# Patient Record
Sex: Male | Born: 1935 | Race: White | Hispanic: No | Marital: Married | Smoking: Never smoker
Health system: Southern US, Community
[De-identification: ages and names within clinical notes are randomized; demographics above are authoritative.]

## PROBLEM LIST (undated history)

## (undated) DIAGNOSIS — M7989 Other specified soft tissue disorders: Secondary | ICD-10-CM

## (undated) DIAGNOSIS — R42 Dizziness and giddiness: Secondary | ICD-10-CM

## (undated) DIAGNOSIS — I639 Cerebral infarction, unspecified: Secondary | ICD-10-CM

## (undated) DIAGNOSIS — I25810 Atherosclerosis of coronary artery bypass graft(s) without angina pectoris: Secondary | ICD-10-CM

## (undated) DIAGNOSIS — I10 Essential (primary) hypertension: Secondary | ICD-10-CM

## (undated) DIAGNOSIS — I259 Chronic ischemic heart disease, unspecified: Secondary | ICD-10-CM

## (undated) DIAGNOSIS — I2510 Atherosclerotic heart disease of native coronary artery without angina pectoris: Secondary | ICD-10-CM

## (undated) DIAGNOSIS — I219 Acute myocardial infarction, unspecified: Secondary | ICD-10-CM

## (undated) DIAGNOSIS — I359 Nonrheumatic aortic valve disorder, unspecified: Secondary | ICD-10-CM

## (undated) DIAGNOSIS — R001 Bradycardia, unspecified: Secondary | ICD-10-CM

## (undated) DIAGNOSIS — E7800 Pure hypercholesterolemia, unspecified: Secondary | ICD-10-CM

## (undated) DIAGNOSIS — I6529 Occlusion and stenosis of unspecified carotid artery: Secondary | ICD-10-CM

## (undated) DIAGNOSIS — R0602 Shortness of breath: Secondary | ICD-10-CM

## (undated) DIAGNOSIS — I350 Nonrheumatic aortic (valve) stenosis: Secondary | ICD-10-CM

## (undated) DIAGNOSIS — C801 Malignant (primary) neoplasm, unspecified: Secondary | ICD-10-CM

## (undated) DIAGNOSIS — I509 Heart failure, unspecified: Secondary | ICD-10-CM

## (undated) DIAGNOSIS — R269 Unspecified abnormalities of gait and mobility: Secondary | ICD-10-CM

## (undated) HISTORY — DX: Dizziness and giddiness: R42

## (undated) HISTORY — DX: Other specified soft tissue disorders: M79.89

## (undated) HISTORY — DX: Malignant (primary) neoplasm, unspecified: C80.1

## (undated) HISTORY — DX: Heart failure, unspecified: I50.9

## (undated) HISTORY — DX: Nonrheumatic aortic valve disorder, unspecified: I35.9

## (undated) HISTORY — DX: Shortness of breath: R06.02

## (undated) HISTORY — DX: Atherosclerosis of coronary artery bypass graft(s) without angina pectoris: I25.810

## (undated) HISTORY — DX: Bradycardia, unspecified: R00.1

## (undated) HISTORY — PX: HEMICOLECTOMY: SHX854

## (undated) HISTORY — DX: Essential (primary) hypertension: I10

## (undated) HISTORY — DX: Atherosclerotic heart disease of native coronary artery without angina pectoris: I25.10

## (undated) HISTORY — PX: APPENDECTOMY: SHX54

## (undated) HISTORY — DX: Occlusion and stenosis of unspecified carotid artery: I65.29

## (undated) HISTORY — DX: Pure hypercholesterolemia, unspecified: E78.00

## (undated) HISTORY — DX: Nonrheumatic aortic (valve) stenosis: I35.0

## (undated) HISTORY — DX: Unspecified abnormalities of gait and mobility: R26.9

## (undated) HISTORY — PX: TONSILLECTOMY: SUR1361

## (undated) HISTORY — DX: Acute myocardial infarction, unspecified: I21.9

## (undated) HISTORY — DX: Chronic ischemic heart disease, unspecified: I25.9

## (undated) HISTORY — DX: Cerebral infarction, unspecified: I63.9

## (undated) HISTORY — PX: COLON SURGERY: SHX602

---

## 1983-09-02 HISTORY — PX: CORONARY ARTERY BYPASS GRAFT: SHX141

## 1996-09-01 HISTORY — PX: OTHER SURGICAL HISTORY: SHX169

## 1998-05-02 ENCOUNTER — Other Ambulatory Visit: Admission: RE | Admit: 1998-05-02 | Discharge: 1998-05-02 | Payer: Self-pay | Admitting: Gastroenterology

## 2002-02-03 ENCOUNTER — Encounter: Admission: RE | Admit: 2002-02-03 | Discharge: 2002-02-03 | Payer: Self-pay | Admitting: Internal Medicine

## 2002-02-03 ENCOUNTER — Encounter: Payer: Self-pay | Admitting: Internal Medicine

## 2002-02-22 ENCOUNTER — Encounter: Payer: Self-pay | Admitting: Neurology

## 2002-02-22 ENCOUNTER — Ambulatory Visit (HOSPITAL_COMMUNITY): Admission: RE | Admit: 2002-02-22 | Discharge: 2002-02-22 | Payer: Self-pay | Admitting: Neurology

## 2002-03-07 ENCOUNTER — Encounter: Payer: Self-pay | Admitting: Neurology

## 2002-03-07 ENCOUNTER — Encounter: Admission: RE | Admit: 2002-03-07 | Discharge: 2002-03-07 | Payer: Self-pay | Admitting: Neurology

## 2004-08-01 ENCOUNTER — Ambulatory Visit: Payer: Self-pay | Admitting: Internal Medicine

## 2004-08-07 ENCOUNTER — Ambulatory Visit: Payer: Self-pay | Admitting: Internal Medicine

## 2004-08-28 ENCOUNTER — Ambulatory Visit: Payer: Self-pay | Admitting: Internal Medicine

## 2004-09-01 HISTORY — PX: CAROTID ANGIOGRAM: SHX5765

## 2004-11-18 ENCOUNTER — Ambulatory Visit: Payer: Self-pay | Admitting: Internal Medicine

## 2004-11-20 ENCOUNTER — Ambulatory Visit: Payer: Self-pay | Admitting: Internal Medicine

## 2005-01-13 ENCOUNTER — Emergency Department (HOSPITAL_COMMUNITY): Admission: EM | Admit: 2005-01-13 | Discharge: 2005-01-14 | Payer: Self-pay | Admitting: Emergency Medicine

## 2005-01-14 ENCOUNTER — Ambulatory Visit: Payer: Self-pay | Admitting: Family Medicine

## 2005-01-17 ENCOUNTER — Inpatient Hospital Stay (HOSPITAL_COMMUNITY): Admission: AD | Admit: 2005-01-17 | Discharge: 2005-01-20 | Payer: Self-pay | Admitting: Neurology

## 2005-01-17 ENCOUNTER — Ambulatory Visit: Payer: Self-pay | Admitting: Physical Medicine & Rehabilitation

## 2005-01-24 ENCOUNTER — Encounter: Payer: Self-pay | Admitting: Neurology

## 2005-01-30 ENCOUNTER — Encounter: Payer: Self-pay | Admitting: Neurology

## 2005-02-05 ENCOUNTER — Ambulatory Visit: Payer: Self-pay | Admitting: Internal Medicine

## 2005-03-01 ENCOUNTER — Encounter: Payer: Self-pay | Admitting: Neurology

## 2005-03-13 ENCOUNTER — Ambulatory Visit: Payer: Self-pay | Admitting: Internal Medicine

## 2005-03-18 ENCOUNTER — Ambulatory Visit: Payer: Self-pay | Admitting: Internal Medicine

## 2005-04-01 ENCOUNTER — Encounter: Payer: Self-pay | Admitting: Neurology

## 2005-04-02 ENCOUNTER — Ambulatory Visit: Payer: Self-pay

## 2005-06-10 ENCOUNTER — Ambulatory Visit: Payer: Self-pay | Admitting: Cardiology

## 2005-06-11 ENCOUNTER — Ambulatory Visit: Payer: Self-pay | Admitting: Cardiology

## 2005-06-11 ENCOUNTER — Inpatient Hospital Stay (HOSPITAL_COMMUNITY): Admission: AD | Admit: 2005-06-11 | Discharge: 2005-06-18 | Payer: Self-pay | Admitting: Cardiology

## 2005-07-01 ENCOUNTER — Ambulatory Visit: Payer: Self-pay | Admitting: Cardiology

## 2005-09-25 ENCOUNTER — Ambulatory Visit: Payer: Self-pay | Admitting: Cardiology

## 2005-10-13 ENCOUNTER — Ambulatory Visit: Payer: Self-pay | Admitting: Family Medicine

## 2005-12-12 ENCOUNTER — Ambulatory Visit: Payer: Self-pay | Admitting: Internal Medicine

## 2005-12-23 ENCOUNTER — Ambulatory Visit: Payer: Self-pay | Admitting: Cardiology

## 2006-01-06 ENCOUNTER — Ambulatory Visit: Payer: Self-pay | Admitting: Cardiology

## 2006-01-15 ENCOUNTER — Ambulatory Visit: Payer: Self-pay | Admitting: Cardiology

## 2006-02-10 ENCOUNTER — Ambulatory Visit: Payer: Self-pay | Admitting: Cardiology

## 2006-03-24 ENCOUNTER — Ambulatory Visit: Payer: Self-pay | Admitting: Cardiology

## 2006-04-15 ENCOUNTER — Ambulatory Visit: Payer: Self-pay | Admitting: Family Medicine

## 2006-04-17 ENCOUNTER — Ambulatory Visit: Payer: Self-pay | Admitting: Family Medicine

## 2006-05-20 ENCOUNTER — Ambulatory Visit: Payer: Self-pay | Admitting: Cardiology

## 2006-05-27 ENCOUNTER — Ambulatory Visit: Payer: Self-pay | Admitting: Family Medicine

## 2006-06-25 ENCOUNTER — Ambulatory Visit: Payer: Self-pay | Admitting: Family Medicine

## 2006-09-07 ENCOUNTER — Ambulatory Visit: Payer: Self-pay | Admitting: Cardiology

## 2006-10-06 ENCOUNTER — Ambulatory Visit: Payer: Self-pay

## 2006-10-07 ENCOUNTER — Ambulatory Visit: Payer: Self-pay | Admitting: Family Medicine

## 2006-10-07 LAB — CONVERTED CEMR LAB
AST: 20 units/L (ref 0–37)
Total Bilirubin: 0.8 mg/dL (ref 0.3–1.2)

## 2006-12-15 ENCOUNTER — Ambulatory Visit: Payer: Self-pay | Admitting: Cardiology

## 2007-05-05 ENCOUNTER — Ambulatory Visit: Payer: Self-pay | Admitting: Cardiology

## 2007-06-25 ENCOUNTER — Ambulatory Visit: Payer: Self-pay | Admitting: Family Medicine

## 2007-06-25 DIAGNOSIS — I2510 Atherosclerotic heart disease of native coronary artery without angina pectoris: Secondary | ICD-10-CM

## 2007-06-28 LAB — CONVERTED CEMR LAB
Calcium: 9.6 mg/dL (ref 8.4–10.5)
Chloride: 104 meq/L (ref 96–112)
GFR calc non Af Amer: 69 mL/min
Sodium: 139 meq/L (ref 135–145)

## 2007-11-09 ENCOUNTER — Ambulatory Visit: Payer: Self-pay | Admitting: Family Medicine

## 2007-12-21 ENCOUNTER — Ambulatory Visit: Payer: Self-pay | Admitting: Cardiology

## 2007-12-21 ENCOUNTER — Encounter: Payer: Self-pay | Admitting: Cardiology

## 2007-12-21 ENCOUNTER — Ambulatory Visit: Payer: Self-pay

## 2007-12-21 LAB — CONVERTED CEMR LAB
CO2: 30 meq/L (ref 19–32)
Calcium: 9.3 mg/dL (ref 8.4–10.5)
Creatinine, Ser: 1.2 mg/dL (ref 0.4–1.5)
GFR calc non Af Amer: 62 mL/min
Potassium: 4.1 meq/L (ref 3.5–5.1)
Sodium: 137 meq/L (ref 135–145)

## 2008-06-12 ENCOUNTER — Ambulatory Visit: Payer: Self-pay | Admitting: Family Medicine

## 2008-06-26 ENCOUNTER — Ambulatory Visit: Payer: Self-pay | Admitting: Cardiology

## 2008-06-26 LAB — CONVERTED CEMR LAB
BUN: 24 mg/dL — ABNORMAL HIGH (ref 6–23)
CO2: 28 meq/L (ref 19–32)
Chloride: 103 meq/L (ref 96–112)
GFR calc Af Amer: 75 mL/min
GFR calc non Af Amer: 62 mL/min
Potassium: 4.3 meq/L (ref 3.5–5.1)
Sodium: 138 meq/L (ref 135–145)

## 2008-11-30 ENCOUNTER — Ambulatory Visit: Payer: Self-pay | Admitting: Family Medicine

## 2008-12-03 LAB — CONVERTED CEMR LAB
AST: 48 units/L — ABNORMAL HIGH (ref 0–37)
Bilirubin, Direct: 0.2 mg/dL (ref 0.0–0.3)
Creatinine, Ser: 1.2 mg/dL (ref 0.4–1.5)
Eosinophils Relative: 0.3 % (ref 0.0–5.0)
HCT: 48.7 % (ref 39.0–52.0)
Hemoglobin: 17.4 g/dL — ABNORMAL HIGH (ref 13.0–17.0)
Lymphocytes Relative: 11.7 % — ABNORMAL LOW (ref 12.0–46.0)
Lymphs Abs: 1.2 10*3/uL (ref 0.7–4.0)
MCHC: 35.8 g/dL (ref 30.0–36.0)
MCV: 89 fL (ref 78.0–100.0)
Monocytes Absolute: 0.7 10*3/uL (ref 0.1–1.0)
Neutro Abs: 8.5 10*3/uL — ABNORMAL HIGH (ref 1.4–7.7)
RDW: 12.7 % (ref 11.5–14.6)

## 2009-01-17 DIAGNOSIS — R42 Dizziness and giddiness: Secondary | ICD-10-CM

## 2009-01-17 DIAGNOSIS — I350 Nonrheumatic aortic (valve) stenosis: Secondary | ICD-10-CM | POA: Insufficient documentation

## 2009-01-17 DIAGNOSIS — I25810 Atherosclerosis of coronary artery bypass graft(s) without angina pectoris: Secondary | ICD-10-CM | POA: Insufficient documentation

## 2009-01-17 DIAGNOSIS — I259 Chronic ischemic heart disease, unspecified: Secondary | ICD-10-CM

## 2009-01-17 DIAGNOSIS — E7800 Pure hypercholesterolemia, unspecified: Secondary | ICD-10-CM

## 2009-01-17 DIAGNOSIS — I209 Angina pectoris, unspecified: Secondary | ICD-10-CM

## 2009-01-17 DIAGNOSIS — I10 Essential (primary) hypertension: Secondary | ICD-10-CM

## 2009-01-18 ENCOUNTER — Ambulatory Visit: Payer: Self-pay | Admitting: Cardiology

## 2009-02-08 ENCOUNTER — Telehealth: Payer: Self-pay | Admitting: Cardiology

## 2009-02-09 ENCOUNTER — Ambulatory Visit: Payer: Self-pay | Admitting: Internal Medicine

## 2009-02-09 ENCOUNTER — Encounter (INDEPENDENT_AMBULATORY_CARE_PROVIDER_SITE_OTHER): Payer: Self-pay | Admitting: Internal Medicine

## 2009-02-09 ENCOUNTER — Inpatient Hospital Stay (HOSPITAL_COMMUNITY): Admission: EM | Admit: 2009-02-09 | Discharge: 2009-02-15 | Payer: Self-pay | Admitting: *Deleted

## 2009-02-10 ENCOUNTER — Encounter: Payer: Self-pay | Admitting: Cardiology

## 2009-02-10 ENCOUNTER — Ambulatory Visit: Payer: Self-pay | Admitting: *Deleted

## 2009-02-13 ENCOUNTER — Encounter: Payer: Self-pay | Admitting: Cardiology

## 2009-02-16 ENCOUNTER — Telehealth: Payer: Self-pay | Admitting: Cardiology

## 2009-02-19 ENCOUNTER — Ambulatory Visit: Payer: Self-pay | Admitting: Cardiology

## 2009-02-19 ENCOUNTER — Ambulatory Visit: Payer: Self-pay

## 2009-03-08 ENCOUNTER — Ambulatory Visit: Payer: Self-pay | Admitting: Cardiology

## 2009-04-30 ENCOUNTER — Ambulatory Visit: Payer: Self-pay | Admitting: Cardiology

## 2009-06-20 ENCOUNTER — Ambulatory Visit: Payer: Self-pay | Admitting: Family Medicine

## 2009-07-17 ENCOUNTER — Ambulatory Visit: Payer: Self-pay | Admitting: Cardiology

## 2009-08-02 ENCOUNTER — Ambulatory Visit: Payer: Self-pay | Admitting: Family Medicine

## 2009-08-03 ENCOUNTER — Telehealth: Payer: Self-pay | Admitting: Family Medicine

## 2009-09-04 ENCOUNTER — Telehealth: Payer: Self-pay | Admitting: Cardiology

## 2009-09-09 ENCOUNTER — Ambulatory Visit: Payer: Self-pay | Admitting: Cardiology

## 2009-09-10 ENCOUNTER — Encounter: Payer: Self-pay | Admitting: Cardiology

## 2009-09-18 ENCOUNTER — Telehealth: Payer: Self-pay | Admitting: Family Medicine

## 2009-09-19 ENCOUNTER — Telehealth: Payer: Self-pay | Admitting: Family Medicine

## 2009-10-01 ENCOUNTER — Ambulatory Visit: Payer: Self-pay | Admitting: Family Medicine

## 2009-10-05 ENCOUNTER — Telehealth: Payer: Self-pay | Admitting: Family Medicine

## 2009-10-05 ENCOUNTER — Encounter: Payer: Self-pay | Admitting: Family Medicine

## 2009-10-09 ENCOUNTER — Telehealth: Payer: Self-pay | Admitting: Family Medicine

## 2009-10-10 ENCOUNTER — Telehealth: Payer: Self-pay | Admitting: Family Medicine

## 2009-10-11 ENCOUNTER — Telehealth: Payer: Self-pay | Admitting: Cardiology

## 2009-10-11 ENCOUNTER — Ambulatory Visit: Payer: Self-pay | Admitting: Cardiology

## 2009-10-17 ENCOUNTER — Telehealth: Payer: Self-pay | Admitting: Family Medicine

## 2009-10-18 ENCOUNTER — Telehealth: Payer: Self-pay | Admitting: Cardiology

## 2009-10-18 ENCOUNTER — Encounter (INDEPENDENT_AMBULATORY_CARE_PROVIDER_SITE_OTHER): Payer: Self-pay | Admitting: *Deleted

## 2009-10-18 ENCOUNTER — Encounter: Payer: Self-pay | Admitting: Family Medicine

## 2009-11-05 ENCOUNTER — Ambulatory Visit: Payer: Self-pay | Admitting: Family Medicine

## 2009-11-05 DIAGNOSIS — R5383 Other fatigue: Secondary | ICD-10-CM

## 2009-11-05 DIAGNOSIS — Z9189 Other specified personal risk factors, not elsewhere classified: Secondary | ICD-10-CM | POA: Insufficient documentation

## 2009-11-05 DIAGNOSIS — R5381 Other malaise: Secondary | ICD-10-CM | POA: Insufficient documentation

## 2009-11-05 LAB — CONVERTED CEMR LAB
Bilirubin Urine: NEGATIVE
Casts: 0 /lpf
Glucose, Urine, Semiquant: NEGATIVE
RBC / HPF: 0
Yeast, UA: 0
pH: 6

## 2009-11-06 LAB — CONVERTED CEMR LAB
BUN: 21 mg/dL (ref 6–23)
CO2: 29 meq/L (ref 19–32)
Calcium: 8.8 mg/dL (ref 8.4–10.5)
Creatinine, Ser: 1.5 mg/dL (ref 0.4–1.5)
Eosinophils Absolute: 0.4 10*3/uL (ref 0.0–0.7)
Eosinophils Relative: 4.5 % (ref 0.0–5.0)
GFR calc non Af Amer: 47.8 mL/min (ref >60)
Glucose, Bld: 82 mg/dL (ref 70–99)
HCT: 41.2 % (ref 39.0–52.0)
Lymphocytes Relative: 23.9 % (ref 12.0–46.0)
Neutro Abs: 4.8 10*3/uL (ref 1.4–7.7)
Neutrophils Relative %: 58.5 % (ref 43.0–77.0)
Potassium: 4.1 meq/L (ref 3.5–5.1)
Sodium: 136 meq/L (ref 135–145)

## 2009-11-09 ENCOUNTER — Encounter: Payer: Self-pay | Admitting: Family Medicine

## 2009-12-14 ENCOUNTER — Ambulatory Visit: Payer: Self-pay | Admitting: Cardiology

## 2010-02-22 ENCOUNTER — Ambulatory Visit: Payer: Self-pay | Admitting: Cardiology

## 2010-03-21 ENCOUNTER — Telehealth: Payer: Self-pay | Admitting: Family Medicine

## 2010-04-02 ENCOUNTER — Ambulatory Visit: Payer: Self-pay | Admitting: Family Medicine

## 2010-04-02 ENCOUNTER — Telehealth: Payer: Self-pay | Admitting: Family Medicine

## 2010-04-02 DIAGNOSIS — M549 Dorsalgia, unspecified: Secondary | ICD-10-CM | POA: Insufficient documentation

## 2010-04-04 ENCOUNTER — Encounter: Payer: Self-pay | Admitting: Family Medicine

## 2010-04-05 ENCOUNTER — Telehealth: Payer: Self-pay | Admitting: Family Medicine

## 2010-04-05 ENCOUNTER — Ambulatory Visit: Payer: Self-pay | Admitting: Family Medicine

## 2010-04-08 ENCOUNTER — Telehealth: Payer: Self-pay | Admitting: Family Medicine

## 2010-04-10 ENCOUNTER — Telehealth: Payer: Self-pay | Admitting: Family Medicine

## 2010-04-18 ENCOUNTER — Telehealth: Payer: Self-pay | Admitting: Family Medicine

## 2010-04-22 ENCOUNTER — Encounter: Payer: Self-pay | Admitting: Family Medicine

## 2010-04-23 ENCOUNTER — Ambulatory Visit: Payer: Self-pay | Admitting: Family Medicine

## 2010-04-24 ENCOUNTER — Encounter: Payer: Self-pay | Admitting: Family Medicine

## 2010-05-07 ENCOUNTER — Telehealth: Payer: Self-pay | Admitting: Family Medicine

## 2010-05-10 ENCOUNTER — Encounter: Payer: Self-pay | Admitting: Family Medicine

## 2010-05-28 ENCOUNTER — Ambulatory Visit: Payer: Self-pay | Admitting: Cardiology

## 2010-05-28 ENCOUNTER — Ambulatory Visit: Payer: Self-pay | Admitting: Family Medicine

## 2010-05-28 ENCOUNTER — Ambulatory Visit (HOSPITAL_COMMUNITY): Admission: RE | Admit: 2010-05-28 | Discharge: 2010-05-28 | Payer: Self-pay | Admitting: Cardiology

## 2010-05-28 ENCOUNTER — Ambulatory Visit: Payer: Self-pay

## 2010-07-22 IMAGING — CR DG CHEST 2V
2 series · 2 of 2 positions shown · non-contrast
Comparison: 02/09/2009

CLINICAL DATA: Shortness of breath

CHEST - 2 VIEW

[w chest pa]
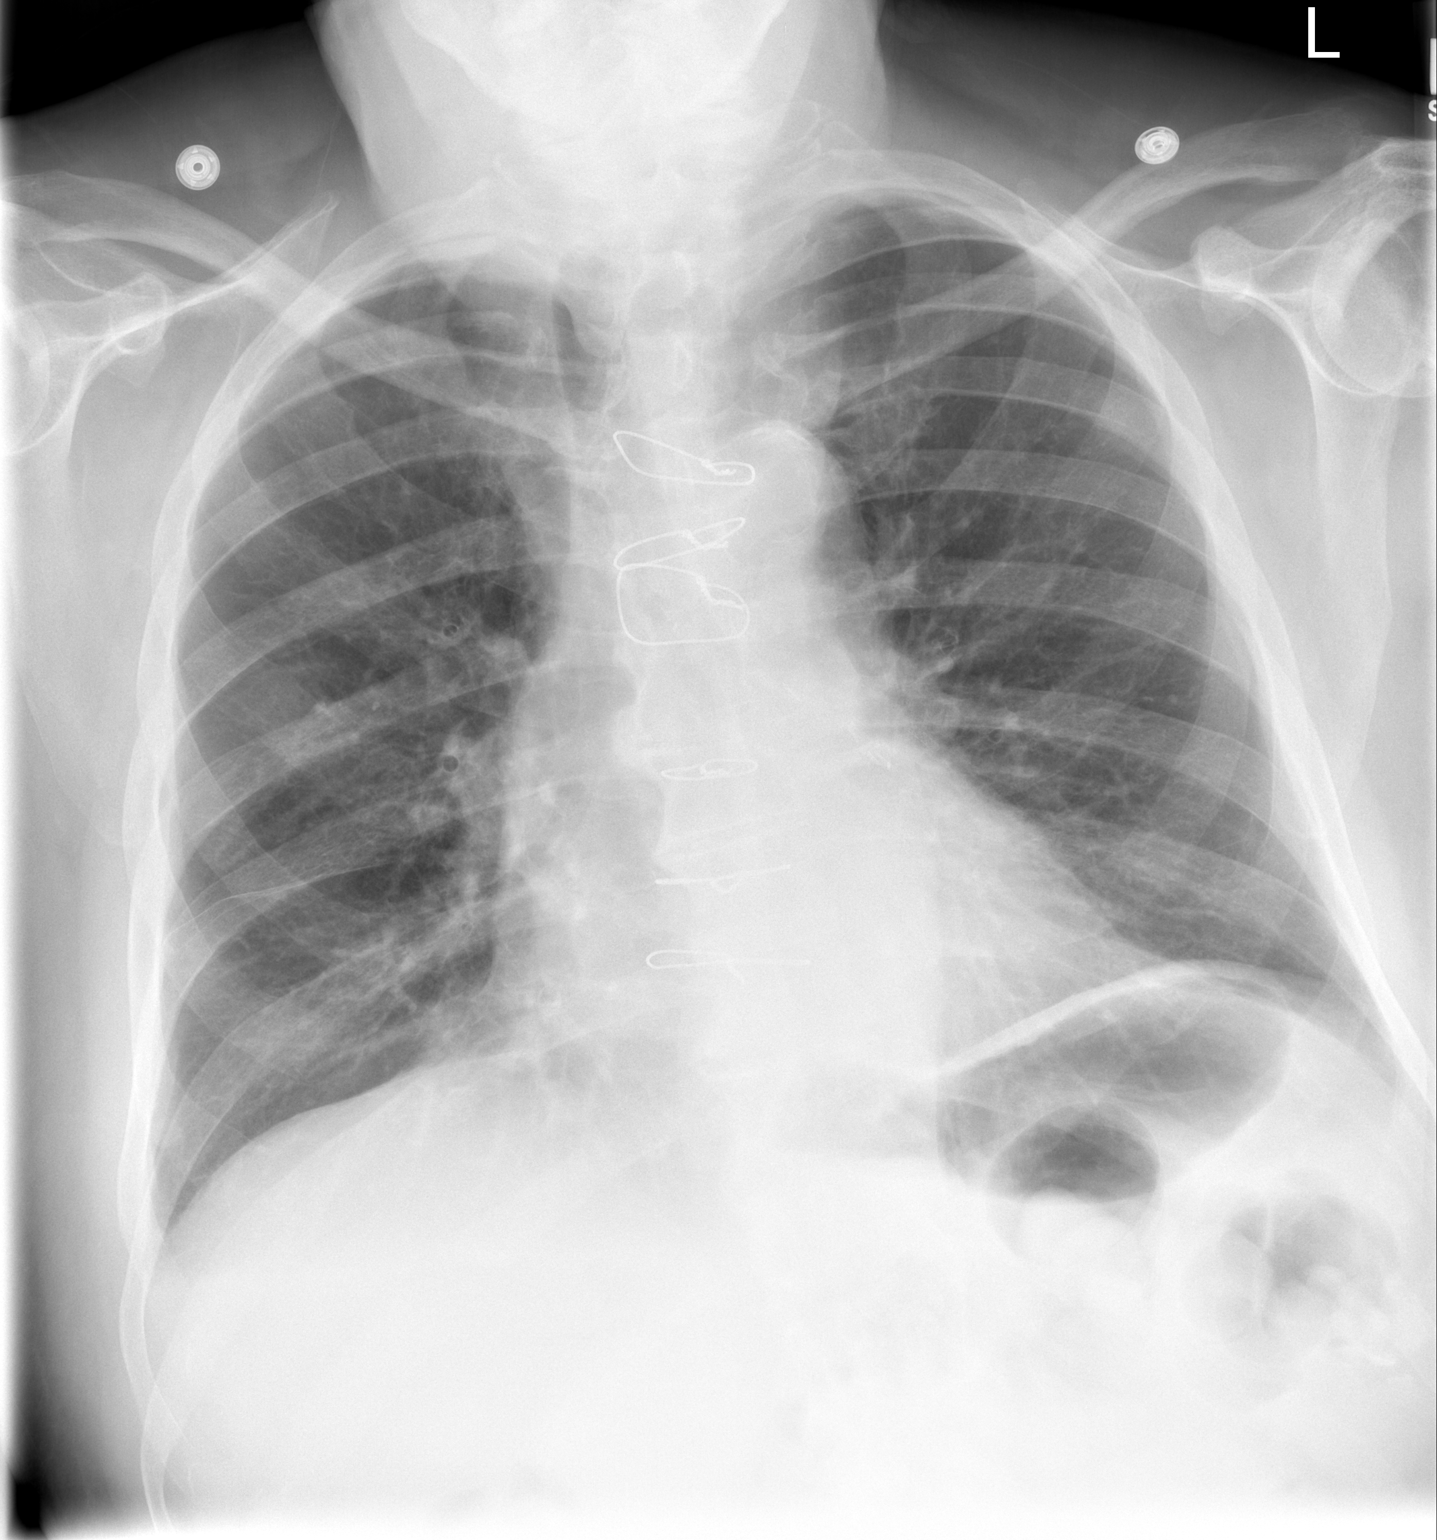

[w chest lat]
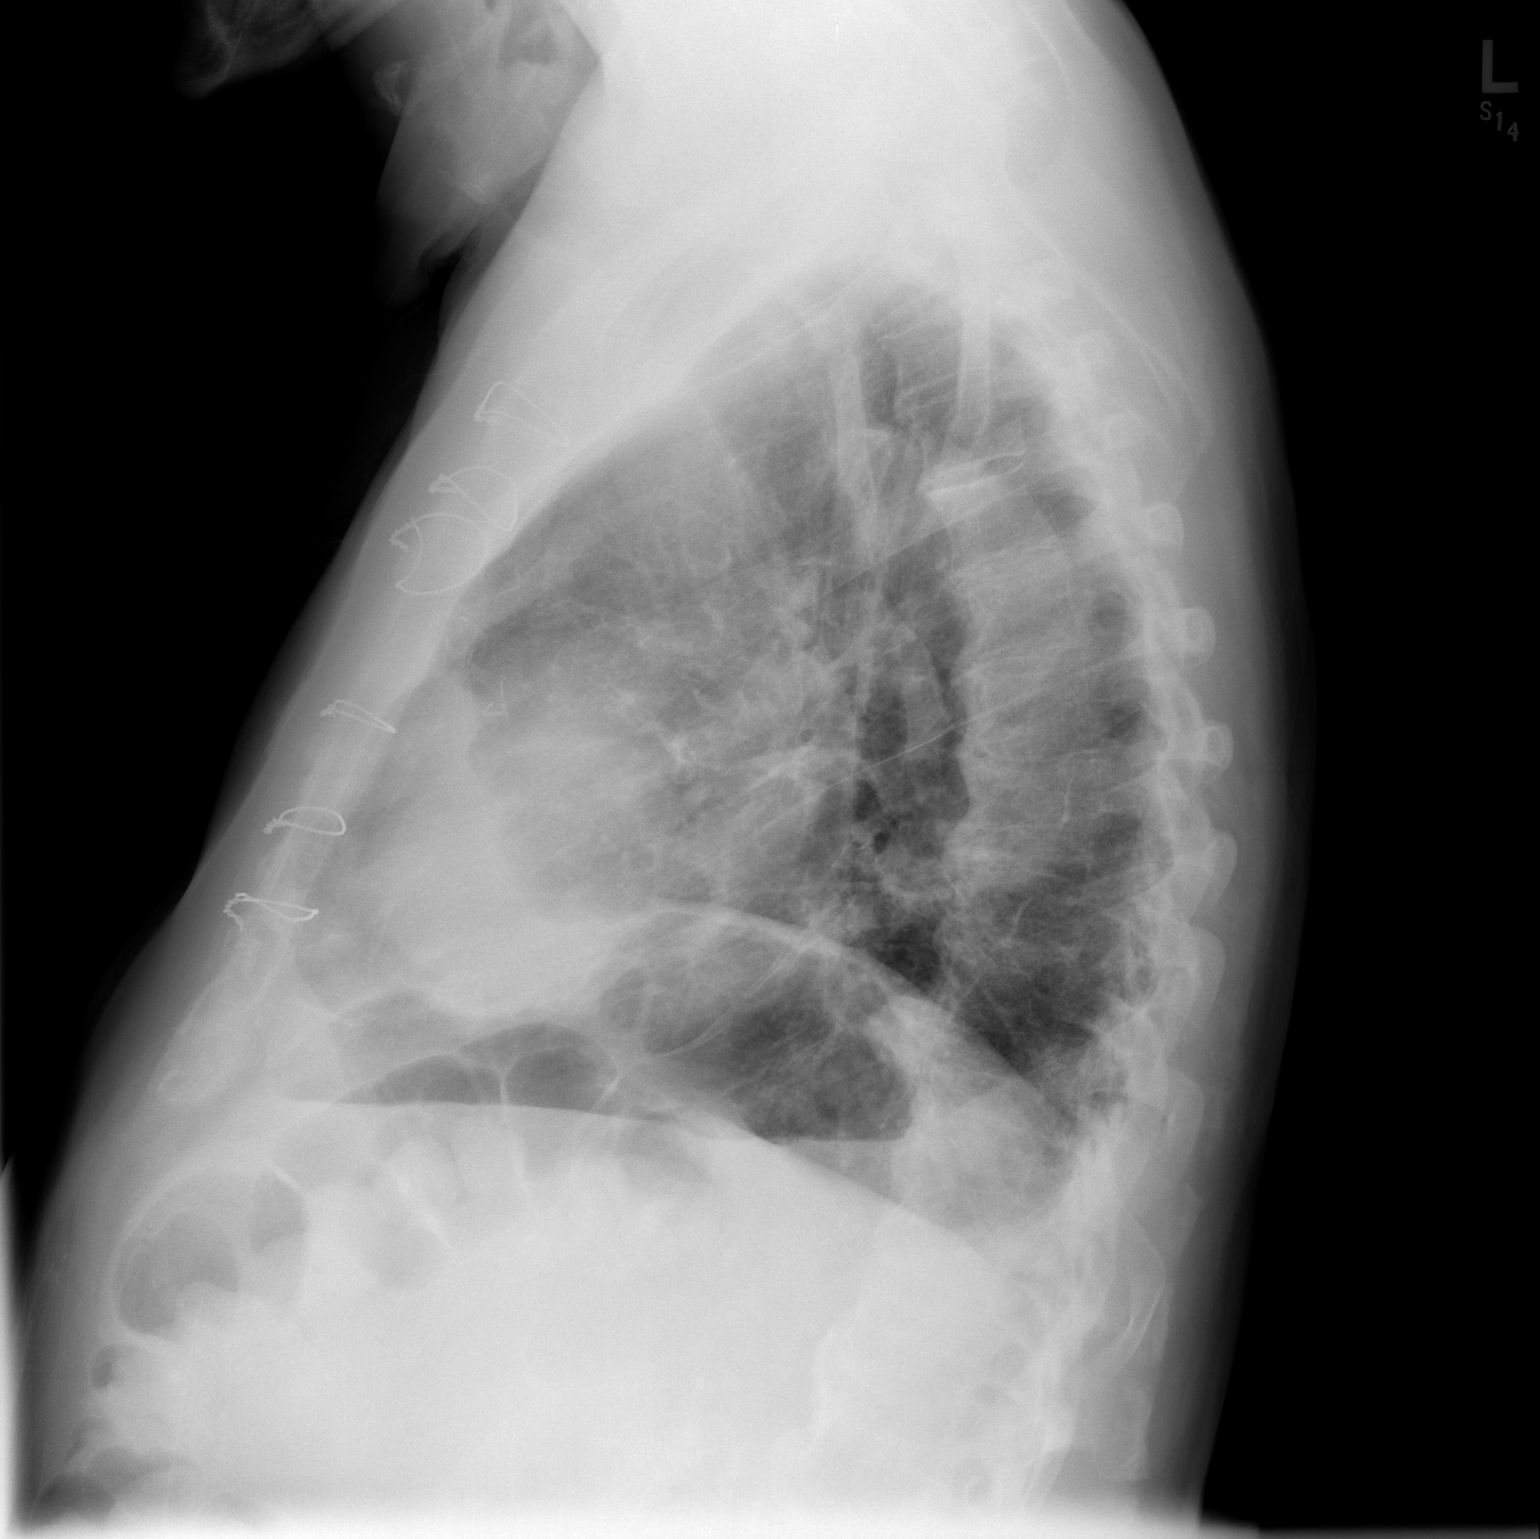

[2 of 2 positions shown; findings below may reference images not displayed]

FINDINGS: Interval improved aeration with partial clearing
pulmonary edema.  Mild cardiac enlargement with postoperative
changes redemonstrated.  Diffuse thoracic spine degenerative
changes redemonstrated.
IMPRESSION: Interval improvement.  Partial clearing edema.

## 2010-08-08 ENCOUNTER — Inpatient Hospital Stay (HOSPITAL_COMMUNITY): Admission: EM | Admit: 2010-08-08 | Discharge: 2009-09-15 | Payer: Self-pay | Admitting: Emergency Medicine

## 2010-09-29 LAB — CONVERTED CEMR LAB
BUN: 19 mg/dL (ref 6–23)
BUN: 19 mg/dL (ref 6–23)
BUN: 20 mg/dL (ref 6–23)
Basophils Relative: 0.4 % (ref 0.0–3.0)
CO2: 27 meq/L (ref 19–32)
CO2: 27 meq/L (ref 19–32)
CO2: 28 meq/L (ref 19–32)
Chloride: 99 meq/L (ref 96–112)
Creatinine, Ser: 1.1 mg/dL (ref 0.4–1.5)
Eosinophils Absolute: 0.2 10*3/uL (ref 0.0–0.7)
Eosinophils Absolute: 0.4 10*3/uL (ref 0.0–0.7)
Eosinophils Absolute: 0.5 10*3/uL (ref 0.0–0.7)
Eosinophils Relative: 4.1 % (ref 0.0–5.0)
Eosinophils Relative: 6.2 % — ABNORMAL HIGH (ref 0.0–5.0)
GFR calc non Af Amer: 44.38 mL/min (ref >60)
GFR calc non Af Amer: 62.36 mL/min (ref >60)
GFR calc non Af Amer: 68.5 mL/min (ref >60)
Glucose, Bld: 114 mg/dL — ABNORMAL HIGH (ref 70–99)
Glucose, Bld: 91 mg/dL (ref 70–99)
Glucose, Bld: 93 mg/dL (ref 70–99)
Glucose, Bld: 97 mg/dL (ref 70–99)
HCT: 44.2 % (ref 39.0–52.0)
HCT: 47.5 % (ref 39.0–52.0)
Hemoglobin: 14.3 g/dL (ref 13.0–17.0)
Hemoglobin: 16.4 g/dL (ref 13.0–17.0)
Lymphocytes Relative: 21.5 % (ref 12.0–46.0)
Lymphocytes Relative: 27.5 % (ref 12.0–46.0)
Lymphs Abs: 1.9 10*3/uL (ref 0.7–4.0)
Lymphs Abs: 2.2 10*3/uL (ref 0.7–4.0)
MCHC: 32.5 g/dL (ref 30.0–36.0)
MCHC: 34.9 g/dL (ref 30.0–36.0)
MCV: 92 fL (ref 78.0–100.0)
MCV: 92.3 fL (ref 78.0–100.0)
Monocytes Absolute: 0.9 10*3/uL (ref 0.1–1.0)
Neutro Abs: 5.2 10*3/uL (ref 1.4–7.7)
Neutrophils Relative %: 60.5 % (ref 43.0–77.0)
Platelets: 224 10*3/uL (ref 150.0–400.0)
Potassium: 4 meq/L (ref 3.5–5.1)
Potassium: 4.1 meq/L (ref 3.5–5.1)
Potassium: 4.4 meq/L (ref 3.5–5.1)
Potassium: 4.4 meq/L (ref 3.5–5.1)
RBC: 4.46 M/uL (ref 4.22–5.81)
RBC: 5.27 M/uL (ref 4.22–5.81)
RDW: 12.7 % (ref 11.5–14.6)
Sodium: 134 meq/L — ABNORMAL LOW (ref 135–145)
Sodium: 136 meq/L (ref 135–145)
Sodium: 139 meq/L (ref 135–145)
WBC: 10.1 10*3/uL (ref 4.5–10.5)
WBC: 8.5 10*3/uL (ref 4.5–10.5)
WBC: 9.6 10*3/uL (ref 4.5–10.5)

## 2010-10-03 NOTE — Progress Notes (Signed)
Summary: [facility]  Phone Note From Other Clinic   Caller: Scott @ [facility] [phone] Call For: Dr. Milinda Antis Summary of Call: calling wanting order for home health to go out 2 times a week for 4 weeks and then 1 time a week for 2 weeks. Also want order for wound care for a skin tear on his left elbow. They wil fax order to clarify wound care. Initial call taken by: Mervin Hack CMA Duncan Dull),  September 19, 2009 4:40 PM  Follow-up for Phone Call        ok - please verbally order home care eval and treat  2 times per week for 4 weeks and then 1 time per week for 2 weeks  also wound care per protocol for skin tear on elbow I will sign the fax when it arrives  Follow-up by: Judith Part MD,  September 19, 2009 4:45 PM  Additional Follow-up for Phone Call Additional follow up Details #1::        Verbal ok given. Additional Follow-up by: Lowella Petties CMA,  September 19, 2009 4:56 PM

## 2010-10-03 NOTE — Progress Notes (Signed)
  Phone Note From Other Clinic Call back at [phone]   Caller: [facility] Call For: Dr.Tower Summary of Call: They want to know if they can get a nursing order to check his hand (cellulitis) and  a tear on his head and elbow from a fall today.  Please fax order to [phone].  They would like to go out tomorrow.  They have the orders for physical therapy. Initial call taken by: Beau Fanny,  September 18, 2009 4:11 PM  Follow-up for Phone Call        will do order / ref and route to [facility] Follow-up by: Judith Part MD,  September 18, 2009 5:27 PM  Additional Follow-up for Phone Call Additional follow up Details #1::        Nursing order faxed to [facility] care Attn [place].  Additional Follow-up by: Carlton Adam,  September 19, 2009 8:04 AM  New Problems: CELLULITIS, HAND (ICD-682.4)   New Problems: CELLULITIS, HAND (ICD-682.4)

## 2010-10-03 NOTE — Progress Notes (Signed)
Summary: Pt fell  Phone Note Call from Patient Call back at Home Phone [phone]   Caller: June-wife Call For: Dr.Tower Summary of Call: Pt. fell again last night.  He fell on the carpet.  His tailbone is sore and he's having pain in his right leg that wasn't bothering him before the second fall, his legs are extremely weak.  His ankles are swollen.  It's hard for him to get up and walk without assistance.  Please advise. Initial call taken by: Beau Fanny,  April 02, 2010 10:14 AM  Follow-up for Phone Call        needs to f/u with first avail doctor when able please Follow-up by: Judith Part MD,  April 02, 2010 10:55 AM  Additional Follow-up for Phone Call Additional follow up Details #1::        Patient's daughter, Lyla Son notified as instructed by telephone. Lyla Son will schedule appt.Lewanda Rife LPN  April 02, 2010 11:44 AM      Appended Document: Pt fell Pt's daughter Lyla Son said since nothing is available for an appt today does Dr Milinda Antis think OK to wait until tomorrow to be seen? Please advise.   Appended Document: Pt fell Rene Kocher worked pt in with Dr Sharen Hones this afternoon at 3pm.

## 2010-10-03 NOTE — Assessment & Plan Note (Signed)
Summary: TOWER FLU SHOT/RB  Nurse Visit   Allergies: 1)  ! Pcn 2)  ! Pravachol  Immunizations Administered:  Influenza Vaccine # 1:    Vaccine Type: Fluvax MCR    Site: right deltoid    Mfr: GlaxoSmithKline    Dose: 0.5 ml    Route: IM    Given by: Mervin Hack CMA (AAMA)    Exp. Date: 03/01/2011    Lot #: [id number]    VIS given: 03/26/10 version given May 28, 2010.  Flu Vaccine Consent Questions:    Do you have a history of severe allergic reactions to this vaccine? no    Any prior history of allergic reactions to egg and/or gelatin? no    Do you have a sensitivity to the preservative Thimersol? no    Do you have a past history of Guillan-Barre Syndrome? no    Do you currently have an acute febrile illness? no    Have you ever had a severe reaction to latex? no    Vaccine information given and explained to patient? yes  Orders Added: 1)  Influenza Vaccine MCR [00025]

## 2010-10-03 NOTE — Progress Notes (Signed)
Summary: Vasotec  Phone Note Call from Patient Call back at Home Phone [phone]   Caller: Spouse Reason for Call: Talk to Nurse Summary of Call: A prescription was phoned in for the patient, Enalapril.  Spouse states he no longer takes this medication. Initial call taken by: Burnard Leigh,  September 04, 2009 11:11 AM  Follow-up for Phone Call        I spoke with the pt's wife and she did not know that Enalapril and Vasotec are the same medication.  The pt is taking Vasotec 2.5mg  once a day.  The pt's wife said this medication had been reduced.  I did not see any documentation of this change.  A prescription was sent in for 5 mg tablets and I instructed the pt's wife that she could cut this in half. Follow-up by: Julieta Gutting, RN, BSN,  September 04, 2009 2:48 PM    New/Updated Medications: VASOTEC 5 MG TABS (ENALAPRIL MALEATE) Take one-half tablet by mouth once a day

## 2010-10-03 NOTE — Progress Notes (Signed)
  Phone Note Call from Patient   Summary of Call: having diarrhea since his hospitalization no fever or abd pain Initial call taken by: Judith Part MD,  October 05, 2009 5:13 PM  Follow-up for Phone Call        order given for c diff test at [facility] update me if symptoms worsen Follow-up by: Judith Part MD,  October 05, 2009 5:14 PM

## 2010-10-03 NOTE — Assessment & Plan Note (Signed)
Summary: follow-up from fall   Vital Signs:  Patient profile:   77 year old male Height:      66 inches Temp:     97.4 degrees F oral Pulse rate:   64 / minute Pulse rhythm:   regular BP sitting:   140 / 72  (left arm) Cuff size:   regular  Vitals Entered By: Lewanda Rife LPN (April 05, 2010 3:02 PM) CC: follow-up visit of fall   History of Present Illness: here for f/u of pain after a fall   seen by Dr Reece Agar here- had LS x ray showing old L4 wedge  ankle nl   was put on tramadol and prednisone for pain and inflammation  also home health ref for safety  was doing well until PT came out this am  overdid it -- made her tailbone more sore   foot is still hurting   first fall - was carrying dishes without cane - lost her footing -- and hit rug  2nd time -- was using a urinal -- lost balance reaching for it   his wife is sleeping downstairs with him-- more safe   L ankle and foot are still quite swollen  leg tends to give way - easily  thinks his legs are weaker in general   bp is really great     Allergies: 1)  ! Pcn 2)  ! Pravachol  Past History:  Past Medical History: Last updated: 01/17/2009  CAD, ARTERY BYPASS GRAFT (ICD-414.04)- Extensive 3 vessel disease HYPERCHOLESTEROLEMIA (ICD-272.0) ISCHEMIC HEART DISEASE (ICD-414.9) HYPERTENSION (ICD-401.9) ANGINA, HX OF (ICD-V12.50) AORTIC STENOSIS (ICD-424.1) ATHEROSCLEROSIS, CORONARY, NATIVE ARTERY (ICD-414.01) VERTIGO (ICD-780.4) NAUSEA WITH VOMITING (ICD-787.01) SHORTNESS OF BREATH (ICD-786.05) EDEMA, HANDS (ICD-729.81) AFTERCARE, LONG-TERM USE, MEDICATIONS NEC (ICD-V58.69)  Past Surgical History: Last updated: 01/17/2009 CABG 1985 Radium seed implant - 1998. Hemicolectomy.  Family History: Last updated: 01/17/2009  Remarkable for heart disease, hypertension, high blood  pressure.  Social History: Last updated: 01/17/2009  He is married and lives with his wife.  He denies use of  tobacco, alcohol,  or illicit drugs.  He has been retired since 38.  Review of Systems General:  Complains of fatigue; denies chills, fever, loss of appetite, and malaise. Eyes:  Denies blurring and eye irritation. CV:  Denies chest pain or discomfort, palpitations, and shortness of breath with exertion. Resp:  Denies cough and wheezing. GI:  Denies abdominal pain, change in bowel habits, hemorrhoids, and nausea. GU:  Denies dysuria, hematuria, and urinary frequency. MS:  Complains of joint pain, joint swelling, and stiffness; denies joint redness. Derm:  Denies itching, lesion(s), poor wound healing, and rash. Neuro:  Denies numbness and tingling. Psych:  mood is ok . Endo:  Denies cold intolerance, excessive thirst, excessive urination, and heat intolerance.  Physical Exam  General:  elderly and frail appearing in wheelchair Head:  normocephalic, atraumatic, and no abnormalities observed.   Eyes:  vision grossly intact, pupils equal, pupils round, and pupils reactive to light.  no conjunctival pallor, injection or icterus  Mouth:  pharynx pink and moist.   Neck:  supple with full rom and no masses or thyromegally, no JVD or carotid bruit  no bony tenderness Chest Wall:  No deformities, masses, tenderness or gynecomastia noted. Lungs:  Normal respiratory effort, chest expands symmetrically. Lungs are clear to auscultation, no crackles or wheezes. Heart:  Normal rate and regular rhythm. S1 and S2 normal without gallop, murmur, click, rub or other extra sounds. Msk:  R  ankle  lateral ecchymosis with minimal tenderness nl rom ankle neg talar tilt can bear partial wt  Pulses:  plus one pedal pulses  Extremities:  trace pedal edema  Neurologic:  DTRs symmetrical and normal.  gait favors L leg Skin:  Intact without suspicious lesions or rashes Cervical Nodes:  No lymphadenopathy noted Psych:  normal affect, talkative and pleasant  mentally sharp today   Impression & Recommendations:  Problem # 1:   BACK PAIN, ACUTE (ICD-724.5) Assessment Improved tailbone pain is gradually imp with current meds  recommend continued PT and donut pillow  also finish prednisone(disc side eff) His updated medication list for this problem includes:    Aspirin 325 Mg Tabs (Aspirin) .Marland Kitchen... 1tab once daily    Tramadol Hcl 50 Mg Tabs (Tramadol hcl) .Marland Kitchen... Take one by mouth two times a day for breakthrough pain    Tylenol Extra Strength 500 Mg Tabs (Acetaminophen) ..... Otc as directed.  Problem # 2:  ANKLE PAIN, RIGHT (ICD-719.47) Assessment: Improved sprain with mild swelling and ecchymosis  nl rom - not overly tender and nl x ray  using walker disc elevation/ ice and consv tx  update if no further imp next week  Problem # 3:  ACCIDENTAL FALLS, RECURRENT (ICD-E888.9) Assessment: Unchanged disc safety measures - esp picking up rugs in house and esp using walker at all times will work with PT to strengthen legs (deconditioned) and also for vestibular training   Complete Medication List: 1)  Furosemide 20 Mg Tabs (Furosemide) .Marland Kitchen.. 1 tab once daily 2)  Atenolol 25 Mg Tabs (Atenolol) .... Take one-half tablet by mouth once a day 3)  Plavix 75 Mg Tabs (Clopidogrel bisulfate) .... Take 1 tablet by mouth once a day 4)  Aspirin 325 Mg Tabs (Aspirin) .Marland Kitchen.. 1tab once daily 5)  Nitroglycerin 0.4 Mg Subl (Nitroglycerin) .... One tablet under tongue every 5 minutes as needed for chest pain---may repeat times three 6)  Vitamin D 1000 Unit Tabs (Cholecalciferol) .... Take 1 tablet by mouth once a day 7)  Isosorbide Mononitrate Cr 120 Mg Xr24h-tab (Isosorbide mononitrate) .... Take one tablet by mouth daily 8)  Vitamin B-12 500 Mcg Tabs (Cyanocobalamin) .... Take 1 tablet by mouth once a day 9)  Vasotec 10 Mg Tabs (Enalapril maleate) .... Take 1 tablet by mouth once a day 10)  Fish Oil 1000 Mg Caps (Omega-3 fatty acids) .... Once a day 11)  Amlodipine Besylate 5 Mg Tabs (Amlodipine besylate) .... Take one tablet by  mouth daily 12)  Tramadol Hcl 50 Mg Tabs (Tramadol hcl) .... Take one by mouth two times a day for breakthrough pain 13)  Prednisone 20 Mg Tabs (Prednisone) .... Take two by mouth daily for 7 days 14)  Tylenol Extra Strength 500 Mg Tabs (Acetaminophen) .... Otc as directed.  Patient Instructions: 1)  get a donut pillow from drugstore  2)  please call [facility] to get report of ua (our fax is down - ? take verbal report)  3)  ice foot and ankle and elevate 10 minutes on 10 minutes off  4)  if legs are sore - topical rubs are fine  5)  continue PT  6)  take all the prednisone as directed  7)  use your walker for ambulation at all times   Current Allergies (reviewed today): ! PCN ! PRAVACHOL

## 2010-10-03 NOTE — Miscellaneous (Signed)
Summary: [facility] Plan of Care  [facility] Plan of Care   Imported By: Beau Fanny 10/02/2009 13:43:42  _____________________________________________________________________  External Attachment:    Type:   Image     Comment:   External Document

## 2010-10-03 NOTE — Assessment & Plan Note (Signed)
Summary: ROV   Visit Type:  rov Primary Provider:  Judith Part MD  CC:  no cardiac complaints today.  History of Present Illness: Still walking, and still doing better.  Doing some walking at the present time. No current angina, and not having anything like he was.   Current Medications (verified): 1)  Furosemide 20 Mg Tabs (Furosemide) .Marland Kitchen.. 1 Tab Once Daily 2)  Atenolol 25 Mg  Tabs (Atenolol) .... Take One-Half Tablet By Mouth Once A Day 3)  Plavix 75 Mg  Tabs (Clopidogrel Bisulfate) .... Take 1 Tablet By Mouth Once A Day 4)  Aspirin 325 Mg  Tabs (Aspirin) .Marland Kitchen.. 1tab Once Daily 5)  Nitroglycerin 0.4 Mg Subl (Nitroglycerin) .... One Tablet Under Tongue Every 5 Minutes As Needed For Chest Pain---May Repeat Times Three 6)  Vitamin D 1000 Unit  Tabs (Cholecalciferol) .... Take 1 Tablet By Mouth Once A Day 7)  Isosorbide Mononitrate Cr 120 Mg Xr24h-Tab (Isosorbide Mononitrate) .... Take One Tablet By Mouth Daily 8)  Vitamin B-12 500 Mcg Tabs (Cyanocobalamin) .... Take 1 Tablet By Mouth Once A Day 9)  Vasotec 10 Mg Tabs (Enalapril Maleate) .... Take 1 Tablet By Mouth Once A Day 10)  Fish Oil 1000 Mg Caps (Omega-3 Fatty Acids) .... Once A Day 11)  Amlodipine Besylate 5 Mg Tabs (Amlodipine Besylate) .... Take One Tablet By Mouth Daily  Allergies: 1)  ! Pcn 2)  ! Pravachol  Vital Signs:  Patient profile:   77 year old male Height:      66 inches Weight:      185 pounds BMI:     29.97 Pulse rate:   50 / minute Pulse rhythm:   irregular BP sitting:   130 / 80  (right arm) Cuff size:   regular  Vitals Entered By: Danielle Rankin, CMA (February 22, 2010 3:44 PM)  Physical Exam  General:  Well developed, well nourished, in no acute distress. Head:  normocephalic and atraumatic Eyes:  PERRLA/EOM intact; conjunctiva and lids normal. Lungs:  Clear bilaterally to auscultation and percussion. Heart:  Non displaced PMI.  Normal S1.  Minimal splitting of S2.  SEM 3/6.  No DM.  Abdomen:  Bowel  sounds positive; abdomen soft and non-tender without masses, organomegaly, or hernias noted. No hepatosplenomegaly. Extremities:  No clubbing or cyanosis. Neurologic:  Alert and oriented x 3.   Impression & Recommendations:  Problem # 1:  ANGINA, HX OF (ICD-V12.50) Controlled at present.  Class II.  Able to do most everything he wants to do.    Problem # 2:  CAD, ARTERY BYPASS GRAFT (ICD-414.04)  see above His updated medication list for this problem includes:    Atenolol 25 Mg Tabs (Atenolol) .Marland Kitchen... Take one-half tablet by mouth once a day    Plavix 75 Mg Tabs (Clopidogrel bisulfate) .Marland Kitchen... Take 1 tablet by mouth once a day    Aspirin 325 Mg Tabs (Aspirin) .Marland Kitchen... 1tab once daily    Nitroglycerin 0.4 Mg Subl (Nitroglycerin) ..... One tablet under tongue every 5 minutes as needed for chest pain---may repeat times three    Isosorbide Mononitrate Cr 120 Mg Xr24h-tab (Isosorbide mononitrate) .Marland Kitchen... Take one tablet by mouth daily    Vasotec 10 Mg Tabs (Enalapril maleate) .Marland Kitchen... Take 1 tablet by mouth once a day    Amlodipine Besylate 5 Mg Tabs (Amlodipine besylate) .Marland Kitchen... Take one tablet by mouth daily  Orders: Echocardiogram (Echo)  His updated medication list for this problem includes:    Atenolol  25 Mg Tabs (Atenolol) .Marland Kitchen... Take one-half tablet by mouth once a day    Plavix 75 Mg Tabs (Clopidogrel bisulfate) .Marland Kitchen... Take 1 tablet by mouth once a day    Aspirin 325 Mg Tabs (Aspirin) .Marland Kitchen... 1tab once daily    Nitroglycerin 0.4 Mg Subl (Nitroglycerin) ..... One tablet under tongue every 5 minutes as needed for chest pain---may repeat times three    Isosorbide Mononitrate Cr 120 Mg Xr24h-tab (Isosorbide mononitrate) .Marland Kitchen... Take one tablet by mouth daily    Vasotec 10 Mg Tabs (Enalapril maleate) .Marland Kitchen... Take 1 tablet by mouth once a day    Amlodipine Besylate 5 Mg Tabs (Amlodipine besylate) .Marland Kitchen... Take one tablet by mouth daily  Problem # 3:  AORTIC STENOSIS (ICD-424.1)  recheck echocardiogram at  next visit. His updated medication list for this problem includes:    Furosemide 20 Mg Tabs (Furosemide) .Marland Kitchen... 1 tab once daily    Atenolol 25 Mg Tabs (Atenolol) .Marland Kitchen... Take one-half tablet by mouth once a day    Nitroglycerin 0.4 Mg Subl (Nitroglycerin) ..... One tablet under tongue every 5 minutes as needed for chest pain---may repeat times three    Isosorbide Mononitrate Cr 120 Mg Xr24h-tab (Isosorbide mononitrate) .Marland Kitchen... Take one tablet by mouth daily    Vasotec 10 Mg Tabs (Enalapril maleate) .Marland Kitchen... Take 1 tablet by mouth once a day  Orders: Echocardiogram (Echo)  His updated medication list for this problem includes:    Furosemide 20 Mg Tabs (Furosemide) .Marland Kitchen... 1 tab once daily    Atenolol 25 Mg Tabs (Atenolol) .Marland Kitchen... Take one-half tablet by mouth once a day    Nitroglycerin 0.4 Mg Subl (Nitroglycerin) ..... One tablet under tongue every 5 minutes as needed for chest pain---may repeat times three    Isosorbide Mononitrate Cr 120 Mg Xr24h-tab (Isosorbide mononitrate) .Marland Kitchen... Take one tablet by mouth daily    Vasotec 10 Mg Tabs (Enalapril maleate) .Marland Kitchen... Take 1 tablet by mouth once a day  Problem # 4:  HYPERTENSION (ICD-401.9) well controlled at present.  His updated medication list for this problem includes:    Furosemide 20 Mg Tabs (Furosemide) .Marland Kitchen... 1 tab once daily    Atenolol 25 Mg Tabs (Atenolol) .Marland Kitchen... Take one-half tablet by mouth once a day    Aspirin 325 Mg Tabs (Aspirin) .Marland Kitchen... 1tab once daily    Vasotec 10 Mg Tabs (Enalapril maleate) .Marland Kitchen... Take 1 tablet by mouth once a day    Amlodipine Besylate 5 Mg Tabs (Amlodipine besylate) .Marland Kitchen... Take one tablet by mouth daily  His updated medication list for this problem includes:    Furosemide 20 Mg Tabs (Furosemide) .Marland Kitchen... 1 tab once daily    Atenolol 25 Mg Tabs (Atenolol) .Marland Kitchen... Take one-half tablet by mouth once a day    Aspirin 325 Mg Tabs (Aspirin) .Marland Kitchen... 1tab once daily    Vasotec 10 Mg Tabs (Enalapril maleate) .Marland Kitchen... Take 1 tablet by mouth  once a day    Amlodipine Besylate 5 Mg Tabs (Amlodipine besylate) .Marland Kitchen... Take one tablet by mouth daily  Patient Instructions: 1)  Your physician recommends that you schedule a follow-up appointment in: 3 MONTHS 2)  Your physician recommends that you continue on your current medications as directed. Please refer to the Current Medication list given to you today. 3)  Your physician has requested that you have an echocardiogram in 3 MONTHS.  Echocardiography is a painless test that uses sound waves to create images of your heart. It provides your doctor with information about the  size and shape of your heart and how well your heart's chambers and valves are working.  This procedure takes approximately one hour. There are no restrictions for this procedure. Prescriptions: ISOSORBIDE MONONITRATE CR 120 MG XR24H-TAB (ISOSORBIDE MONONITRATE) Take one tablet by mouth daily  #90 x 3   Entered by:   Danielle Rankin, CMA   Authorized by:   Ronaldo Miyamoto, MD, [facility]   Signed by:   Danielle Rankin, CMA on 02/22/2010   Method used:   Electronically to        [facility] [phone]* (retail)       [address] [place], [place]  [address]       Ph: [phone] or [phone]       Fax: [phone]   RxID:   [id number]

## 2010-10-03 NOTE — Assessment & Plan Note (Signed)
Summary: TAILBONE,LEG PAIN/CLE   Vital Signs:  Patient profile:   77 year old male Temp:     97.6 degrees F oral Pulse rate:   70 / minute Pulse rhythm:   regular BP sitting:   104 / 60  (left arm) Cuff size:   regular  Vitals Entered By: Selena Batten Dance CMA Duncan Dull) (April 02, 2010 3:05 PM) CC: Right leg and tailbone pain after fall Comments Pt. in wheelchair-unable to weigh   History of Present Illness: CC: tailbone and right leg pain  77 yo with h/o CAD s/p CABG 1985, HTN, HLD, aortic stenosis presents after recurrent fall.  2 wks ago fell backwards and landed on bottom, used ice and seat cushion and started feeling better.  Since first fall has started using walker (prior only used cane).  Last night got up to use urinal and fell backward onto floor (carpeted).  Feeling legs weaker R>L, pain in right leg and swelling leg.  Pain starts in foot and goes up to hip.  No hip pain per se.  Even uncomfortable while sitting down.  Taking aspirin and tylenol for pain.  Not really helping.  Unable to bear weight on right leg very well.  Is on aspirin 325mg  and plavix for blood thinner.  Current Medications (verified): 1)  Furosemide 20 Mg Tabs (Furosemide) .Marland Kitchen.. 1 Tab Once Daily 2)  Atenolol 25 Mg  Tabs (Atenolol) .... Take One-Half Tablet By Mouth Once A Day 3)  Plavix 75 Mg  Tabs (Clopidogrel Bisulfate) .... Take 1 Tablet By Mouth Once A Day 4)  Aspirin 325 Mg  Tabs (Aspirin) .Marland Kitchen.. 1tab Once Daily 5)  Nitroglycerin 0.4 Mg Subl (Nitroglycerin) .... One Tablet Under Tongue Every 5 Minutes As Needed For Chest Pain---May Repeat Times Three 6)  Vitamin D 1000 Unit  Tabs (Cholecalciferol) .... Take 1 Tablet By Mouth Once A Day 7)  Isosorbide Mononitrate Cr 120 Mg Xr24h-Tab (Isosorbide Mononitrate) .... Take One Tablet By Mouth Daily 8)  Vitamin B-12 500 Mcg Tabs (Cyanocobalamin) .... Take 1 Tablet By Mouth Once A Day 9)  Vasotec 10 Mg Tabs (Enalapril Maleate) .... Take 1 Tablet By Mouth Once A  Day 10)  Fish Oil 1000 Mg Caps (Omega-3 Fatty Acids) .... Once A Day 11)  Amlodipine Besylate 5 Mg Tabs (Amlodipine Besylate) .... Take One Tablet By Mouth Daily  Allergies: 1)  ! Pcn 2)  ! Pravachol  Past History:  Past medical, surgical, family and social histories (including risk factors) reviewed, and no changes noted (except as noted below).  Past Medical History: Reviewed history from 01/17/2009 and no changes required.  CAD, ARTERY BYPASS GRAFT (ICD-414.04)- Extensive 3 vessel disease HYPERCHOLESTEROLEMIA (ICD-272.0) ISCHEMIC HEART DISEASE (ICD-414.9) HYPERTENSION (ICD-401.9) ANGINA, HX OF (ICD-V12.50) AORTIC STENOSIS (ICD-424.1) ATHEROSCLEROSIS, CORONARY, NATIVE ARTERY (ICD-414.01) VERTIGO (ICD-780.4) NAUSEA WITH VOMITING (ICD-787.01) SHORTNESS OF BREATH (ICD-786.05) EDEMA, HANDS (ICD-729.81) AFTERCARE, LONG-TERM USE, MEDICATIONS NEC (ICD-V58.69)  Past Surgical History: Reviewed history from 01/17/2009 and no changes required. CABG 1985 Radium seed implant - 1998. Hemicolectomy.  Family History: Reviewed history from 01/17/2009 and no changes required.  Remarkable for heart disease, hypertension, high blood  pressure.  Social History: Reviewed history from 01/17/2009 and no changes required.  He is married and lives with his wife.  He denies use of  tobacco, alcohol, or illicit drugs.  He has been retired since 35.  Review of Systems       per HPI  Physical Exam  General:  frail/ elderly and in  good spirits, in wheelchair, unable to bear weight on his own Msk:  swelling lumbosacral spine (extra lordosis), Maximal tenderness midline and paraspinous mm lower lumbar/sacral spine  + SLR test right sided, but no pain with int/ext rotation at hip.  No greater trochanteric pain bilaterally.  4-/5 strength RLE (2/2 pain), 4+/5 strength LLE.  DTRs intact bilaterally.  Needs assistance to transfer from wheelchair to exam table, unable to ambulate on  own.  Point tenderness superior to right lateral malleolus, also tenderness with palpation at ATF ligament Extremities:  trace left pedal edema and trace right pedal edema. Swelling not significantly different between legs.  No bruising/ecchymosis. Skin:  Intact without suspicious lesions or rashes   Impression & Recommendations:  Problem # 1:  ACCIDENTAL FALL FROM BED (ICD-E884.4) pain at lumbosacral spine.  ? compression fracture.  obtain LS spine xray.  HHPT and home safety evaluation.  Orders: T-Ankle Comp Right ([id number]) T-Lumbar Spine Comp w/[facility] [phone]) Physical Therapy Referral (PT)  Problem # 2:  BACK PAIN, ACUTE (ICD-724.5) with evident swelling midline.  LS spine in office read as old compression fracture, .  so likely lumbar strain.  I feel leg weakness 2/2 pain not actually neurological deficit.  Discussed case with patient and family in room - they prefer to attempt pain control as outpatient.  See pt instructions for med choice.  Red flags to seek urgent medical care discussed.  close f/u with Dr. Milinda Antis or myself this week.  His updated medication list for this problem includes:    Aspirin 325 Mg Tabs (Aspirin) .Marland Kitchen... 1tab once daily    Tramadol Hcl 50 Mg Tabs (Tramadol hcl) .Marland Kitchen... Take one by mouth two times a day for breakthrough pain  Orders: Physical Therapy Referral (PT)  Problem # 3:  ANKLE PAIN, RIGHT (ICD-719.47) given point tenderness superior to right lateral malleolus along with distracting injury to back, obtained ankle xray.  No fx.  likely low grade ankle sprain.  tx supportively.  Complete Medication List: 1)  Furosemide 20 Mg Tabs (Furosemide) .Marland Kitchen.. 1 tab once daily 2)  Atenolol 25 Mg Tabs (Atenolol) .... Take one-half tablet by mouth once a day 3)  Plavix 75 Mg Tabs (Clopidogrel bisulfate) .... Take 1 tablet by mouth once a day 4)  Aspirin 325 Mg Tabs (Aspirin) .Marland Kitchen.. 1tab once daily 5)  Nitroglycerin 0.4 Mg Subl (Nitroglycerin) .... One tablet  under tongue every 5 minutes as needed for chest pain---may repeat times three 6)  Vitamin D 1000 Unit Tabs (Cholecalciferol) .... Take 1 tablet by mouth once a day 7)  Isosorbide Mononitrate Cr 120 Mg Xr24h-tab (Isosorbide mononitrate) .... Take one tablet by mouth daily 8)  Vitamin B-12 500 Mcg Tabs (Cyanocobalamin) .... Take 1 tablet by mouth once a day 9)  Vasotec 10 Mg Tabs (Enalapril maleate) .... Take 1 tablet by mouth once a day 10)  Fish Oil 1000 Mg Caps (Omega-3 fatty acids) .... Once a day 11)  Amlodipine Besylate 5 Mg Tabs (Amlodipine besylate) .... Take one tablet by mouth daily 12)  Tramadol Hcl 50 Mg Tabs (Tramadol hcl) .... Take one by mouth two times a day for breakthrough pain 13)  Prednisone 20 Mg Tabs (Prednisone) .... Take two by mouth daily for 7 days  Patient Instructions: 1)  Return later this week to see Dr. Milinda Antis for follow up. 2)  Continue using seat cushion for comfort.  Start prednisone course for next week, continue icing and heating back for pain.  Tylenol 500mg   every 4 hours scheduled, then tramadol 50mg  for break through pain (up to twice daily). 3)  If pain or weakness becomes worse or you start having numbness, you need to seek urgent medical care. 4)  Please call clinic with questions.  Pleasure to meet you today. Prescriptions: PREDNISONE 20 MG TABS (PREDNISONE) take two by mouth daily for 7 days  #14 x 0   Entered and Authorized by:   Eustaquio Boyden  MD   Signed by:   Eustaquio Boyden  MD on 04/02/2010   Method used:   Electronically to        [facility] [phone]* (retail)       [address] [place], [place]  [address]       Ph: [phone] or [phone]       Fax: [phone]   RxID:   [id number] TRAMADOL HCL 50 MG TABS (TRAMADOL HCL) take one by mouth two times a day for breakthrough pain  #30 x 0   Entered and Authorized by:   Eustaquio Boyden  MD   Signed by:   Eustaquio Boyden  MD on 04/02/2010   Method used:    Electronically to        [facility] [phone]* (retail)       [address] [place], [place]  [address]       Ph: [phone] or [phone]       Fax: [phone]   RxID:   [id number]   Current Allergies (reviewed today): ! PCN ! PRAVACHOL  Appended Document: TAILBONE,LEG PAIN/CLE late entry:  no HA, dizziness, syncope, LOC prior to fall, no urinary/bowel incontinence.  Appended Document: TAILBONE,LEG PAIN/CLE    Clinical Lists Changes  Orders: Added new Referral order of Home Health Referral [facility]) - Signed     Family asking about possibility of catheter placement as Mr. Ferrelli having increased need to urinate.  Will ask HHRN to go and evaluate need, obtain UA/Cx if having dysuria, and ok to place catheter.  Per family, pt making urine and voiding well.  To keep appt Friday with Dr. Milinda Antis. Eustaquio Boyden  MD  April 03, 2010 12:25 PM

## 2010-10-03 NOTE — Assessment & Plan Note (Signed)
Summary: per check out/sf   Visit Type:  Follow-up Primary Provider:  Judith Part MD  CC:  Blood pressure issues.  History of Present Illness: Overall feel prety well. His BP have been high.  Not alot of rest chest pain.  BP was much higher during March madness.  Atenolol --he is now taking a full one. Was eating alot of tuna fish.    Current Medications (verified): 1)  Furosemide 20 Mg Tabs (Furosemide) .Marland Kitchen.. 1 Tab Once Daily 2)  Atenolol 25 Mg  Tabs (Atenolol) .... Take One-Half Tablet By Mouth Once A Day 3)  Plavix 75 Mg  Tabs (Clopidogrel Bisulfate) .... Take 1 Tablet By Mouth Once A Day 4)  Aspirin 325 Mg  Tabs (Aspirin) .Marland Kitchen.. 1tab Once Daily 5)  Nitroglycerin 0.4 Mg Subl (Nitroglycerin) .... One Tablet Under Tongue Every 5 Minutes As Needed For Chest Pain---May Repeat Times Three 6)  Vitamin D 1000 Unit  Tabs (Cholecalciferol) .... Take 1 Tablet By Mouth Once A Day 7)  Isosorbide Mononitrate Cr 120 Mg Xr24h-Tab (Isosorbide Mononitrate) .... Take One Tablet By Mouth Daily 8)  Vitamin B-12 500 Mcg Tabs (Cyanocobalamin) .... Take 1 Tablet By Mouth Once A Day 9)  Vasotec 10 Mg Tabs (Enalapril Maleate) .... Take 1 Tablet By Mouth Once A Day 10)  Fish Oil 1000 Mg Caps (Omega-3 Fatty Acids) .... Once A Day  Allergies: 1)  ! Pcn 2)  ! Pravachol  Vital Signs:  Patient profile:   77 year old male Height:      66 inches Weight:      185 pounds BMI:     29.97 Pulse rate:   51 / minute Pulse rhythm:   irregular Resp:     18 per minute BP sitting:   158 / 77  (right arm) Cuff size:   large  Vitals Entered By: Vikki Ports (December 14, 2009 12:44 PM)  Physical Exam  General:  Well developed, well nourished, in no acute distress. Head:  normocephalic and atraumatic Eyes:  PERRLA/EOM intact; conjunctiva and lids normal. Lungs:  Clear bilaterally to auscultation and percussion. Heart:  PMI non displaced.  Normal S1 and S2.  pos S4.  SEM 3/6. Abdomen:  Bowel sounds positive;  abdomen soft and non-tender without masses, organomegaly, or hernias noted. No hepatosplenomegaly. Extremities:  No clubbing or cyanosis.  No edema.   EKG  Procedure date:  12/14/2009  Findings:      SB.  Old septal infarct.  No acute changes.   Echocardiogram  Procedure date:  02/09/2009  Findings:       Study Conclusions    1. Left ventricle: There is severe hypokinesis of the anterior wall      and the anterior septum and the apical aspects of these walls.      The cavity size was normal. Wall thickness was increased in a      pattern of mild LVH. The estimated ejection fraction was 45%.   2. Aortic valve: There is significant thickening of the aortic valve      leaflets, but the AS is only mild. Mild regurgitation. Valve      area: 1.12cm^2(VTI). Valve area: 1.01cm^2 (Vmax).   3. Mitral valve: Mildly thickened leaflets . Valve area by      continuity equation (using LVOT flow): 1.57cm^2.   4. Left atrium: The atrium was mildly dilated.   Impression & Recommendations:  Problem # 1:  CAD, ARTERY BYPASS GRAFT (ICD-414.04) He actually is doing  a bit better.  Tolerating medical therapy.  We discussed dietary issues, relative to relative Na content, and impact.   Favor contiued medical therapy. We have discussed and reviewed his anatomy, which is fairly high risk.  PCI of CFX could be entertained, but would also be somewhat high risk undertaking.  He is content with the current medical approach, as is his family at this point.  They understand big picture.  His updated medication list for this problem includes:    Atenolol 25 Mg Tabs (Atenolol) .Marland Kitchen... Take one-half tablet by mouth once a day    Plavix 75 Mg Tabs (Clopidogrel bisulfate) .Marland Kitchen... Take 1 tablet by mouth once a day    Aspirin 325 Mg Tabs (Aspirin) .Marland Kitchen... 1tab once daily    Nitroglycerin 0.4 Mg Subl (Nitroglycerin) ..... One tablet under tongue every 5 minutes as needed for chest pain---may repeat times three    Isosorbide  Mononitrate Cr 120 Mg Xr24h-tab (Isosorbide mononitrate) .Marland Kitchen... Take one tablet by mouth daily    Vasotec 10 Mg Tabs (Enalapril maleate) .Marland Kitchen... Take 1 tablet by mouth once a day    Amlodipine Besylate 5 Mg Tabs (Amlodipine besylate) .Marland Kitchen... Take one tablet by mouth daily  His updated medication list for this problem includes:    Atenolol 25 Mg Tabs (Atenolol) .Marland Kitchen... Take one-half tablet by mouth once a day    Plavix 75 Mg Tabs (Clopidogrel bisulfate) .Marland Kitchen... Take 1 tablet by mouth once a day    Aspirin 325 Mg Tabs (Aspirin) .Marland Kitchen... 1tab once daily    Nitroglycerin 0.4 Mg Subl (Nitroglycerin) ..... One tablet under tongue every 5 minutes as needed for chest pain---may repeat times three    Isosorbide Mononitrate Cr 120 Mg Xr24h-tab (Isosorbide mononitrate) .Marland Kitchen... Take one tablet by mouth daily    Vasotec 10 Mg Tabs (Enalapril maleate) .Marland Kitchen... Take 1 tablet by mouth once a day  Problem # 2:  AORTIC STENOSIS (ICD-424.1) See echo report from last June.  Symptoms remain stable.  Plan repeat after next OV.   His updated medication list for this problem includes:    Furosemide 20 Mg Tabs (Furosemide) .Marland Kitchen... 1 tab once daily    Atenolol 25 Mg Tabs (Atenolol) .Marland Kitchen... Take one-half tablet by mouth once a day    Nitroglycerin 0.4 Mg Subl (Nitroglycerin) ..... One tablet under tongue every 5 minutes as needed for chest pain---may repeat times three    Isosorbide Mononitrate Cr 120 Mg Xr24h-tab (Isosorbide mononitrate) .Marland Kitchen... Take one tablet by mouth daily    Vasotec 10 Mg Tabs (Enalapril maleate) .Marland Kitchen... Take 1 tablet by mouth once a day  Patient Instructions: 1)  Your physician recommends that you schedule a follow-up appointment in: 2 months 2)  Your physician has recommended you make the following change in your medication: please start norvasc (amlodipine) 5mg  1 tablet everyday Prescriptions: AMLODIPINE BESYLATE 5 MG TABS (AMLODIPINE BESYLATE) Take one tablet by mouth daily  #90 x 3   Entered by:   Julieta Gutting, RN,  BSN   Authorized by:   Ronaldo Miyamoto, MD, [facility]   Signed by:   Julieta Gutting, RN, BSN on 12/14/2009   Method used:   Electronically to        [facility] [phone]* (retail)       [address]       [place], [place]  [address]       Ph: [phone] or [phone]  Fax: [phone]   RxID:   [id number]

## 2010-10-03 NOTE — Progress Notes (Signed)
  Phone Note Call from Patient   Summary of Call: still having some diarrhea after flagyl - but overall much better   Follow-up for Phone Call        will ext course to 14 d and update if no further imp   px sent- Carrie aware Follow-up by: Judith Part MD,  October 17, 2009 2:10 PM    Prescriptions: METRONIDAZOLE 500 MG TABS (METRONIDAZOLE) 1 by mouth two times a day  #14 x 0   Entered and Authorized by:   Judith Part MD   Signed by:   Judith Part MD on 10/17/2009   Method used:   Electronically to        [facility] [phone]* (retail)       [address] [place], [place]  [address]       Ph: [phone] or [phone]       Fax: [phone]   RxID:   [id number]

## 2010-10-03 NOTE — Progress Notes (Signed)
Summary: Diarrhea  Phone Note Call from Patient Call back at Home Phone [phone]   Caller: June Leard-wife Call For: Dr.Tower Summary of Call: Pt. is having diarrhea about 5 times a day and he is getting weaker.  The pepto bismol is helping,so that he can make it to the bathroom.  Since his lab results won't be in until Thursday is there anything he should be doing until the results come in. Initial call taken by: Beau Fanny,  October 09, 2009 1:09 PM  Follow-up for Phone Call        I want to empirically start him on flagyl in case of c diff  Camelia Eng is looking into what happened at [facility] to delay results px written on EMR for call in  Follow-up by: Judith Part MD,  October 09, 2009 1:49 PM  Additional Follow-up for Phone Call Additional follow up Details #1::        Patient's daughter Lyla Son notified as instructed by telephone. Medication phoned to [facility] pharmacy as instructed. Lewanda Rife LPN  October 09, 2009 1:53 PM     New/Updated Medications: METRONIDAZOLE 500 MG TABS (METRONIDAZOLE) 1 by mouth two times a day Prescriptions: METRONIDAZOLE 500 MG TABS (METRONIDAZOLE) 1 by mouth two times a day  #14 x 0   Entered and Authorized by:   Judith Part MD   Signed by:   Judith Part MD on 10/09/2009   Method used:   Telephoned to ...       [facility] [phone]* (retail)       [address] [place], [place]  [address]       Ph: [phone] or [phone]       Fax: [phone]   RxID:   [id number]

## 2010-10-03 NOTE — Progress Notes (Signed)
Summary: ok given for aide  Phone Note From Other Clinic   Caller: Toni Amend with [facility] [phone] Summary of Call: Pt is requesting a home health aid to help with bathing.  They are asking for verbal order for aid to go out for twice a week for 3 weeks.  Verbal ok given.               Lowella Petties CMA  April 08, 2010 4:00 PM   Follow-up for Phone Call        that is fine - just send paperwork and I will sign it when it comes Follow-up by: Judith Part MD,  April 08, 2010 4:54 PM  Additional Follow-up for Phone Call Additional follow up Details #1::        Courtney notified as instructed by telephone. Lewanda Rife LPN  April 08, 2010 5:29 PM

## 2010-10-03 NOTE — Letter (Signed)
Summary: CMN for [facility]  CMN for [facility]   Imported By: Lanelle Bal 05/01/2010 08:58:36  _____________________________________________________________________  External Attachment:    Type:   Image     Comment:   External Document

## 2010-10-03 NOTE — Miscellaneous (Signed)
Summary: HHA Order/[facility]  HHA Order/[facility]   Imported By: Lanelle Bal 04/29/2010 08:36:02  _____________________________________________________________________  External Attachment:    Type:   Image     Comment:   External Document

## 2010-10-03 NOTE — Miscellaneous (Signed)
Summary: Face to Face Encounter & Care Plan/[facility]  Face to Face Encounter & Care Plan/[facility]   Imported By: Lanelle Bal 04/29/2010 08:37:59  _____________________________________________________________________  External Attachment:    Type:   Image     Comment:   External Document

## 2010-10-03 NOTE — Assessment & Plan Note (Signed)
Summary: ROV   Visit Type:  Follow-up Primary Provider:  Judith Part MD   History of Present Illness: Was having diahreaa, and was noted to have C. diff.  Now on medications for this.  BP has come down.  Diahreaa has improved.  No significant angina noted.  Cath results reviewed with family in room.  Current Medications (verified): 1)  Furosemide 20 Mg Tabs (Furosemide) .Marland Kitchen.. 1 Tab Once Daily 2)  Atenolol 25 Mg  Tabs (Atenolol) .... Take 1 Tablet By Mouth Once A Day 3)  Plavix 75 Mg  Tabs (Clopidogrel Bisulfate) .... Take 1 Tablet By Mouth Once A Day 4)  Aspirin 325 Mg  Tabs (Aspirin) .Marland Kitchen.. 1tab Once Daily 5)  Nitroglycerin 0.4 Mg Subl (Nitroglycerin) .... One Tablet Under Tongue Every 5 Minutes As Needed For Chest Pain---May Repeat Times Three 6)  Vitamin D 1000 Unit  Tabs (Cholecalciferol) .... Take 1 Tablet By Mouth Once A Day 7)  Isosorbide Mononitrate Cr 120 Mg Xr24h-Tab (Isosorbide Mononitrate) .... Take One Tablet By Mouth Daily 8)  Metronidazole 500 Mg Tabs (Metronidazole) .Marland Kitchen.. 1 By Mouth Two Times A Day 9)  Vitamin B-12 500 Mcg Tabs (Cyanocobalamin) .... Take 1 Tablet By Mouth Once A Day 10)  Vasotec 10 Mg Tabs (Enalapril Maleate) .... Take 1 Tablet By Mouth Once A Day 11)  Fish Oil 1000 Mg Caps (Omega-3 Fatty Acids) .... Once A Day  Allergies: 1)  ! Pcn 2)  ! Pravachol  Vital Signs:  Patient profile:   77 year old male Height:      66 inches Weight:      184.25 pounds BMI:     29.85 Pulse rate:   54 / minute Pulse rhythm:   irregular Resp:     18 per minute BP sitting:   112 / 60  (left arm) Cuff size:   large  Vitals Entered By: Vikki Ports (October 11, 2009 1:14 PM)  Physical Exam  General:  Well developed, well nourished, in no acute distress.  Chronically ill. Lungs:  Clear bilaterally to auscultation and percussion. Heart:  Normal S1 and S2.  SEM. Abdomen:  No masses.  No hepatosplenomegaly. Neurologic:  Appears intact.   EKG  Procedure date:   10/11/2009  Findings:      SB.  Otherwise normal.  Cardiac Cath  Procedure date:  09/13/2009  Findings:        FINAL ASSESSMENT: 1. Severe native three-vessel coronary artery disease. 2. Patent saphenous vein graft to diagonal branch with continued     patency of recently placed stent at the coronary anastomotic site. 3. 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 4. Extensive collateral networks supplying the distal left anterior     descending and distal right coronary artery circulation.   PLAN:  The patient appears to have stabilized.  I suspect his non-ST- elevation infarct is related to LAD occlusion, as this is the only change in anatomy when compared to his previous study.  This is now collateralized.  He does have significant ostial circumflex stenosis with heavy calcification, but this is unchanged in appearance and has been managed medically.  We will continue treatment of his cellulitis and treat him medically for his coronary artery disease.  If he has recurrent ischemic symptoms, it would be reasonable to consider treating his native circumflex.  Veverly Fells. Excell Seltzer, MD       MDC/MEDQ  D:  09/13/2009  T:  09/14/2009  Job:  [id number]   cc:   Arturo Morton. Riley Kill, MD, [facility] Marne A. Tower, MD  Signed by Judith Part MD on 09/26/2009 at 8:55 PM  Signed by Ronaldo Miyamoto, MD, [facility] on 09/28/2009 at 9:14 PM  Impression & Recommendations:  Problem # 1:  CELLULITIS, HAND (ICD-682.4) improved The following medications were removed from the medication list:    Biaxin Xl 500 Mg Xr24h-tab (Clarithromycin) .Marland Kitchen... 2 pills by mouth once daily for 10 days His updated medication list for this problem includes:    Metronidazole 500 Mg Tabs (Metronidazole) .Marland Kitchen... 1 by mouth two times a day  Problem # 2:  CAD, ARTERY BYPASS GRAFT (ICD-414.04) see cath  report.  Unfavorable ostial lesions of cfx and both diagonals.  Symptoms stable.  Not a redo candidate.  Currently favor conservative management in the absence of progressive symptoms.  SVG stent remains widely patent. The following medications were removed from the medication list:    Vasotec 5 Mg Tabs (Enalapril maleate) .Marland Kitchen... Take one-half tablet by mouth once a day His updated medication list for this problem includes:    Atenolol 25 Mg Tabs (Atenolol) .Marland Kitchen... Take 1 tablet by mouth once a day    Plavix 75 Mg Tabs (Clopidogrel bisulfate) .Marland Kitchen... Take 1 tablet by mouth once a day    Aspirin 325 Mg Tabs (Aspirin) .Marland Kitchen... 1tab once daily    Nitroglycerin 0.4 Mg Subl (Nitroglycerin) ..... One tablet under tongue every 5 minutes as needed for chest pain---may repeat times three    Isosorbide Mononitrate Cr 120 Mg Xr24h-tab (Isosorbide mononitrate) .Marland Kitchen... Take one tablet by mouth daily    Vasotec 10 Mg Tabs (Enalapril maleate) .Marland Kitchen... Take 1 tablet by mouth once a day  Orders: TLB-CBC Platelet - w/Differential (85025-CBCD) EKG w/ Interpretation (93000)  Problem # 3:  HYPERTENSION (ICD-401.9) Reviewed medications.   The following medications were removed from the medication list:    Vasotec 5 Mg Tabs (Enalapril maleate) .Marland Kitchen... Take one-half tablet by mouth once a day His updated medication list for this problem includes:    Furosemide 20 Mg Tabs (Furosemide) .Marland Kitchen... 1 tab once daily    Atenolol 25 Mg Tabs (Atenolol) .Marland Kitchen... Take 1 tablet by mouth once a day    Aspirin 325 Mg Tabs (Aspirin) .Marland Kitchen... 1tab once daily    Vasotec 10 Mg Tabs (Enalapril maleate) .Marland Kitchen... Take 1 tablet by mouth once a day  Orders: EKG w/ Interpretation (93000)  Problem # 4:  AORTIC STENOSIS (ICD-424.1) stable The following medications were removed from the medication list:    Vasotec 5 Mg Tabs (Enalapril maleate) .Marland Kitchen... Take one-half tablet by mouth once a day His updated medication list for this problem includes:    Furosemide 20  Mg Tabs (Furosemide) .Marland Kitchen... 1 tab once daily    Atenolol 25 Mg Tabs (Atenolol) .Marland Kitchen... Take 1 tablet by mouth once a day    Nitroglycerin 0.4 Mg Subl (Nitroglycerin) ..... One tablet under tongue every 5 minutes as needed for chest pain---may repeat times three    Isosorbide Mononitrate Cr 120 Mg Xr24h-tab (Isosorbide mononitrate) .Marland Kitchen... Take one tablet by mouth daily    Vasotec 10 Mg Tabs (Enalapril maleate) .Marland Kitchen... Take 1 tablet by mouth once a day  Orders: TLB-CBC Platelet - w/Differential (85025-CBCD)  Other Orders: TLB-BMP (Basic Metabolic Panel-BMET) (80048-METABOL)  Patient Instructions: 1)  Your physician recommends that you  have lab work today: CBC, BMP 2)  Your physician recommends that you schedule a follow-up appointment in: 2 MONTHS 3)  Your physician recommends that you continue on your current medications as directed. Please refer to the Current Medication list given to you today. Prescriptions: VASOTEC 10 MG TABS (ENALAPRIL MALEATE) Take 1 tablet by mouth once a day  #30 x 6   Entered by:   Julieta Gutting, RN, BSN   Authorized by:   Ronaldo Miyamoto, MD, [facility]   Signed by:   Julieta Gutting, RN, BSN on 10/11/2009   Method used:   Electron[facility] to        [facility] [phone]* (retail)       [address] [place], [place]  [address]       Ph: [phone] or [phone]       Fax: [phone]   RxID:   [id number]

## 2010-10-03 NOTE — Progress Notes (Signed)
Summary: urinalysis report  Phone Note From Other Clinic   Caller: Sheryl with [facility] Summary of Call: Nurse with [facility] has faxed urinalysis report, on your shelf. Initial call taken by: Lowella Petties CMA,  April 05, 2010 4:08 PM  Follow-up for Phone Call        urine looks clear - please let them know  Follow-up by: Judith Part MD,  April 05, 2010 4:54 PM  Additional Follow-up for Phone Call Additional follow up Details #1::        Notified patient's spouse as instructed. Additional Follow-up by: Linde Gillis CMA Duncan Dull),  April 08, 2010 9:04 AM

## 2010-10-03 NOTE — Progress Notes (Signed)
Summary: low hr  Phone Note From Other Clinic Call back at [phone]   Caller: Courtney/[facility] Summary of Call: hr today is 48 Initial call taken by: Migdalia Dk,  October 18, 2009 1:22 PM  Follow-up for Phone Call        I left a message for Toni Amend that I will get in touch with the pt's wife about decreasing Atenolol dosage.   I spoke with the pt's wife and she will decrease the pt's Atenolol to 25mg  one-half tablet daily.  Home Health will continue monitoring the patient.  The pt's wife agrees with plan.  Follow-up by: Julieta Gutting, RN, BSN,  October 18, 2009 2:16 PM    New/Updated Medications: ATENOLOL 25 MG  TABS (ATENOLOL) Take one-half tablet by mouth once a day  Appended Document: low hr agree with the above plan.  TS

## 2010-10-03 NOTE — Miscellaneous (Signed)
Summary: [facility],Physical Therapy Orders  [facility],Physical Therapy Orders   Imported By: Beau Fanny 10/26/2009 14:29:23  _____________________________________________________________________  External Attachment:    Type:   Image     Comment:   External Document

## 2010-10-03 NOTE — Progress Notes (Signed)
Summary: needs order for roller walker  Phone Note From Other Clinic   Caller: Letta Moynahan with [facility]  [phone] Summary of Call: Pt is requesting verbal  order for a walker with wheels.  He has standard walker now but his balance is not good. Please call Letta Moynahan with [facility] at [phone] with verbal order when given. Initial call taken by: Lowella Petties CMA,  April 10, 2010 3:17 PM  Follow-up for Phone Call        go ahead and give the verbal order for rolling walker  code [id number] I will sign paperwork when they send it  Follow-up by: Judith Part MD,  April 11, 2010 12:44 PM  Additional Follow-up for Phone Call Additional follow up Details #1::        Tresa Endo with [facility] notified as instructed by telephone. Lewanda Rife LPN  April 11, 2010 12:56 PM

## 2010-10-03 NOTE — Miscellaneous (Signed)
Summary: Controlled Substance Agreement  Controlled Substance Agreement   Imported By: Lanelle Bal 04/11/2010 12:54:27  _____________________________________________________________________  External Attachment:    Type:   Image     Comment:   External Document

## 2010-10-03 NOTE — Progress Notes (Signed)
Summary: update on pt heart rate  Phone Note From Other Clinic Call back at [phone]   Caller: [facility] Request: Talk with Nurse Summary of Call: yesterday when pt was seen heart rate is running 50-54 and they just wanted to make you aware because that is abnormal Initial call taken by: Omer Jack,  October 11, 2009 10:40 AM  Follow-up for Phone Call        I have reviewed the pt's EKG records in the chart and the pt's heart rate has ran in the low 50's by EKG.  The pt is scheduled to see Dr Riley Kill today in follow-up. Follow-up by: Julieta Gutting, RN, BSN,  October 11, 2009 10:48 AM

## 2010-10-03 NOTE — Consult Note (Signed)
Summary: Consultation Report [facility]  Consultation Report - [facility]   Imported By: Marylou Mccoy 09/28/2009 09:38:29  _____________________________________________________________________  External Attachment:    Type:   Image     Comment:   External Document

## 2010-10-03 NOTE — Assessment & Plan Note (Signed)
Summary: 3 month rov   Visit Type:  Follow-up Primary Provider:  Judith Part MD  CC:  Pt needs refill on plavix and atenolol and also nitro.Marland Kitchen  History of Present Illness: Overall about the same.  No angina.  He is falling a bit more frequently. BP varies from time to time.  Lower in the am, and higher in the pm.  We discussed his situation in some detail in the office with regard to medication strategies.  For now, we will likely sit tight.    Current Medications (verified): 1)  Furosemide 20 Mg Tabs (Furosemide) .Marland Kitchen.. 1 Tab Once Daily 2)  Atenolol 25 Mg  Tabs (Atenolol) .... Take One-Half Tablet By Mouth Once A Day 3)  Plavix 75 Mg  Tabs (Clopidogrel Bisulfate) .... Take 1 Tablet By Mouth Once A Day 4)  Aspirin 325 Mg  Tabs (Aspirin) .Marland Kitchen.. 1tab Once Daily 5)  Nitroglycerin 0.4 Mg Subl (Nitroglycerin) .... One Tablet Under Tongue Every 5 Minutes As Needed For Chest Pain---May Repeat Times Three 6)  Vitamin D 1000 Unit  Tabs (Cholecalciferol) .... Take 1 Tablet By Mouth Once A Day 7)  Isosorbide Mononitrate Cr 120 Mg Xr24h-Tab (Isosorbide Mononitrate) .... Take One Tablet By Mouth Daily 8)  Vitamin B-12 500 Mcg Tabs (Cyanocobalamin) .... Take 1 Tablet By Mouth Once A Day 9)  Vasotec 10 Mg Tabs (Enalapril Maleate) .... Take 1 Tablet By Mouth Once A Day 10)  Fish Oil 1000 Mg Caps (Omega-3 Fatty Acids) .... Once A Day 11)  Amlodipine Besylate 5 Mg Tabs (Amlodipine Besylate) .... Take One-Half  Tablet By Mouth Daily 12)  Nystatin 100000 Unit/gm Crea (Nystatin) .... Apply To Affected Area Two Times A Day As Needed For Yeast Skin Infection  Allergies (verified): 1)  ! Pcn 2)  ! Pravachol  Past History:  Past Medical History: Last updated: 01/17/2009  CAD, ARTERY BYPASS GRAFT (ICD-414.04)- Extensive 3 vessel disease HYPERCHOLESTEROLEMIA (ICD-272.0) ISCHEMIC HEART DISEASE (ICD-414.9) HYPERTENSION (ICD-401.9) ANGINA, HX OF (ICD-V12.50) AORTIC STENOSIS (ICD-424.1) ATHEROSCLEROSIS,  CORONARY, NATIVE ARTERY (ICD-414.01) VERTIGO (ICD-780.4) NAUSEA WITH VOMITING (ICD-787.01) SHORTNESS OF BREATH (ICD-786.05) EDEMA, HANDS (ICD-729.81) AFTERCARE, LONG-TERM USE, MEDICATIONS NEC (ICD-V58.69)  Vital Signs:  Patient profile:   77 year old male Height:      66 inches Weight:      188 pounds BMI:     30.45 Pulse rate:   55 / minute BP sitting:   110 / 60  (left arm)  Vitals Entered By: Laurance Flatten CMA (May 28, 2010 11:02 AM) CC: Pt needs refill on plavix and atenolol, also nitro.   Physical Exam  General:  Well developed, well nourished, in no acute distress. Head:  normocephalic and atraumatic Eyes:  PERRLA/EOM intact; conjunctiva and lids normal. Lungs:  Clear bilaterally to auscultation and percussion. Heart:  SEM  3/6.  No diastolic murmur.   Extremities:  trace edema LLE. Neurologic:  Alert and oriented x 3.   EKG  Procedure date:  05/28/2010  Findings:      Study Conclusions            - Left ventricle: There is hypokinesis of the anterior wall and       anterior septum and anterior apex. The other walls move well. The       EF is 40-45%. Wall thickness was increased in a pattern of mild       LVH.     - Aortic valve: There is some calcification with very mild AS. Mild  regurgitation. Valve area: 1.34cm 2(VTI). Valve area: 1.35cm 2       (Vmax).     - Left atrium: The atrium was moderately dilated.     - Right atrium: The atrium was mildly dilated.     - Pulmonary arteries: PA peak pressure: 35mm Hg (S).  EKG  Procedure date:  05/28/2010  Findings:      Nonspecifc ST and T changes.   Impression & Recommendations:  Problem # 1:  CAD, ARTERY BYPASS GRAFT (ICD-414.04) No angina, but generally inactive at this point in time.  No serious changes in ECG at this point.  His updated medication list for this problem includes:    Atenolol 25 Mg Tabs (Atenolol) .Marland Kitchen... Take one-half tablet by mouth once a day    Plavix 75 Mg Tabs  (Clopidogrel bisulfate) .Marland Kitchen... Take 1 tablet by mouth once a day    Aspirin 325 Mg Tabs (Aspirin) .Marland Kitchen... 1tab once daily    Nitroglycerin 0.4 Mg Subl (Nitroglycerin) ..... One tablet under tongue every 5 minutes as needed for chest pain---may repeat times three    Isosorbide Mononitrate Cr 120 Mg Xr24h-tab (Isosorbide mononitrate) .Marland Kitchen... Take one tablet by mouth daily    Vasotec 10 Mg Tabs (Enalapril maleate) .Marland Kitchen... Take 1 tablet by mouth once a day    Amlodipine Besylate 5 Mg Tabs (Amlodipine besylate) .Marland Kitchen... Take one-half  tablet by mouth daily  Orders: EKG w/ Interpretation (93000) TLB-BMP (Basic Metabolic Panel-BMET) (80048-METABOL)  Problem # 2:  AORTIC STENOSIS (ICD-424.1) echo reviewed with patient and family.  Moderate aortic stenosis. His updated medication list for this problem includes:    Furosemide 20 Mg Tabs (Furosemide) .Marland Kitchen... 1 tab once daily    Atenolol 25 Mg Tabs (Atenolol) .Marland Kitchen... Take one-half tablet by mouth once a day    Nitroglycerin 0.4 Mg Subl (Nitroglycerin) ..... One tablet under tongue every 5 minutes as needed for chest pain---may repeat times three    Isosorbide Mononitrate Cr 120 Mg Xr24h-tab (Isosorbide mononitrate) .Marland Kitchen... Take one tablet by mouth daily    Vasotec 10 Mg Tabs (Enalapril maleate) .Marland Kitchen... Take 1 tablet by mouth once a day  Orders: EKG w/ Interpretation (93000) TLB-BMP (Basic Metabolic Panel-BMET) (80048-METABOL)  Problem # 3:  HYPERCHOLESTEROLEMIA (ICD-272.0) NOt on statins at this point.  Orders: EKG w/ Interpretation (93000) TLB-BMP (Basic Metabolic Panel-BMET) (80048-METABOL)  Problem # 4:  HYPERTENSION (ICD-401.9) Lower in am but higher in pm, therefore will continue amlodipine as prescribed.  His updated medication list for this problem includes:    Furosemide 20 Mg Tabs (Furosemide) .Marland Kitchen... 1 tab once daily    Atenolol 25 Mg Tabs (Atenolol) .Marland Kitchen... Take one-half tablet by mouth once a day    Aspirin 325 Mg Tabs (Aspirin) .Marland Kitchen... 1tab once daily     Vasotec 10 Mg Tabs (Enalapril maleate) .Marland Kitchen... Take 1 tablet by mouth once a day    Amlodipine Besylate 5 Mg Tabs (Amlodipine besylate) .Marland Kitchen... Take one-half  tablet by mouth daily  Orders: EKG w/ Interpretation (93000) TLB-BMP (Basic Metabolic Panel-BMET) (80048-METABOL)  Patient Instructions: 1)  Your physician recommends that you have lab work today: BMP 2)  Your physician recommends that you continue on your current medications as directed. Please refer to the Current Medication list given to you today. 3)  Your physician wants you to follow-up in:  4 MONTHS.  You will receive a reminder letter in the mail two months in advance. If you don't receive a letter, please call our office to schedule  the follow-up appointment. Prescriptions: ATENOLOL 25 MG  TABS (ATENOLOL) Take one-half tablet by mouth once a day  #90 x 3   Entered by:   Julieta Gutting, RN, BSN   Authorized by:   Ronaldo Miyamoto, MD, [facility]   Signed by:   Julieta Gutting, RN, BSN on 05/28/2010   Method used:   [facility] to        [facility] [phone]* (retail)       [address]       [place], [place]  [address]       Ph: [phone] or [phone]       Fax: [phone]   RxID:   [id number] PLAVIX 75 MG  TABS (CLOPIDOGREL BISULFATE) Take 1 tablet by mouth once a day  #90 x 3   Entered by:   Julieta Gutting, RN, BSN   Authorized by:   Ronaldo Miyamoto, MD, [facility]   Signed by:   Julieta Gutting, RN, BSN on 05/28/2010   Method used:   [facility] to        [facility] [phone]* (retail)       [address] [place], [place]  [address]       Ph: [phone] or [phone]       Fax: [phone]   RxID:   [id number]

## 2010-10-03 NOTE — Miscellaneous (Signed)
Summary: added dx diarrhea  Clinical Lists Changes  Problems: Added new problem of DIARRHEA (ICD-787.91)

## 2010-10-03 NOTE — Progress Notes (Signed)
  Phone Note Call from Patient Call back at Home Phone [phone]   Caller: June Wedig-wife Call For: Dr.Dilyn Osoria Summary of Call: Pt. fell on Monday and he has been having pain in his tailbone. He's in pain and his legs are weak when he gets up and when he walks.  He has been taking 1- 325 mg Aspirin in the morning and 2- Tylenol Extra Strength in the afternoon and before he goes to bed.  He has been putting ice on it and sitting on a towel made into a donut shape.  Is there anything else he can try to help w/ the pain that wouldn't make him groggy? Initial call taken by: Beau Fanny,  March 21, 2010 10:39 AM  Follow-up for Phone Call        he is doing everything right - esp the ice  can inc the acetaminophen/ tylenol to 2 of the 500 mg pills up to every 4 hours  if not further improved please f/u  Follow-up by: Judith Part MD,  March 21, 2010 12:16 PM  Additional Follow-up for Phone Call Additional follow up Details #1::        Patient notified. Additional Follow-up by: Beau Fanny,  March 21, 2010 1:15 PM

## 2010-10-03 NOTE — Progress Notes (Signed)
Summary: home health  Phone Note Other Incoming   Caller: Toni Amend from [facility]- [phone] Summary of Call: Toni Amend is asking if you would extend order for home health for twice a week for two weeks. Please advise. Initial call taken by: Melody Comas,  May 07, 2010 2:09 PM  Follow-up for Phone Call        that is fine - just have the home care agency send the paperwork and I will do it Follow-up by: Judith Part MD,  May 07, 2010 3:08 PM  Additional Follow-up for Phone Call Additional follow up Details #1::        Patient's daughter Lyla Son notified as instructed by telephone. Left message for Toni Amend to call back. Lewanda Rife LPN  May 07, 2010 4:23 PM   Toni Amend with [facility] notified as instructed by telephone. Lewanda Rife LPN  May 07, 2010 4:37 PM

## 2010-10-03 NOTE — Miscellaneous (Signed)
Summary: [facility]charge/[facility]  Discharge/[facility]   Imported By: Lanelle Bal 08/16/2010 09:41:24  _____________________________________________________________________  External Attachment:    Type:   Image     Comment:   External Document

## 2010-10-03 NOTE — Assessment & Plan Note (Signed)
Summary: FEVER,WEAK/CLE   Vital Signs:  Patient profile:   77 year old male Height:      66 inches Weight:      184.50 pounds BMI:     29.89 Temp:     98 degrees F oral Pulse rate:   56 / minute Pulse rhythm:   irregular BP sitting:   100 / 60  (left arm) Cuff size:   large  Vitals Entered By: Lewanda Rife LPN (November 05, 1608 10:45 AM)  History of Present Illness: woke up yest am -- was very weak-- could hardly move  not really stiff  not a lot of strength -- no focal numbness or weakness however  felt heavy all over  got up later in that day and got a lot better   has had cellulitis -- that is gone  and just got over c diff - 2 days of normal stools after flagyl  temp was 100  yesterday scratchy throat and that is better  sneezing more no runny or stuffy nose    no cardiac symptoms at all no chest pain or sob  cardiologist - recently decreased b blocker for low hr  now feels better with heartrate in 50s   Allergies: 1)  ! Pcn 2)  ! Pravachol  Past History:  Past Medical History: Last updated: 01/17/2009  CAD, ARTERY BYPASS GRAFT (ICD-414.04)- Extensive 3 vessel disease HYPERCHOLESTEROLEMIA (ICD-272.0) ISCHEMIC HEART DISEASE (ICD-414.9) HYPERTENSION (ICD-401.9) ANGINA, HX OF (ICD-V12.50) AORTIC STENOSIS (ICD-424.1) ATHEROSCLEROSIS, CORONARY, NATIVE ARTERY (ICD-414.01) VERTIGO (ICD-780.4) NAUSEA WITH VOMITING (ICD-787.01) SHORTNESS OF BREATH (ICD-786.05) EDEMA, HANDS (ICD-729.81) AFTERCARE, LONG-TERM USE, MEDICATIONS NEC (ICD-V58.69)  Past Surgical History: Last updated: 01/17/2009 CABG 1985 Radium seed implant - 1998. Hemicolectomy.  Family History: Last updated: 01/17/2009  Remarkable for heart disease, hypertension, high blood  pressure.  Social History: Last updated: 01/17/2009  He is married and lives with his wife.  He denies use of  tobacco, alcohol, or illicit drugs.  He has been retired since 45.  Review of Systems General:   Complains of fatigue, fever, and weakness; denies chills, loss of appetite, and sweats; fatigue is improved . Eyes:  Denies blurring, discharge, and eye irritation. ENT:  Denies nasal congestion, postnasal drainage, sinus pressure, and sore throat. CV:  Denies chest pain or discomfort, lightheadness, palpitations, shortness of breath with exertion, and swelling of feet. Resp:  Denies cough, shortness of breath, and wheezing. GI:  Denies abdominal pain, bloody stools, change in bowel habits, dark tarry stools, diarrhea, indigestion, loss of appetite, nausea, and vomiting. GU:  Complains of incontinence; denies hematuria, urinary frequency, and urinary hesitancy. MS:  Denies joint pain, joint redness, and joint swelling. Derm:  Denies itching, lesion(s), poor wound healing, and rash. Neuro:  Denies falling down, headaches, numbness, tingling, and visual disturbances. Psych:  mood has been ok . Endo:  Denies cold intolerance, excessive thirst, excessive urination, and heat intolerance. Heme:  Denies abnormal bruising and bleeding.  Physical Exam  General:  frail/ elderly and in good spirits  Head:  normocephalic, atraumatic, and no abnormalities observed.  no sinus tenderness  Eyes:  vision grossly intact, pupils equal, pupils round, and pupils reactive to light.  no conjunctival pallor, injection or icterus  Ears:  R ear normal and L ear normal.   Nose:  no nasal discharge.   Mouth:  poor dentition with broken teeth- but no signs of infection  Neck:  supple with full rom and no masses or thyromegally, no JVD or  carotid bruit  Lungs:  CTA no crackles  Heart:  Normal rate and regular rhythm. S1 and S2 normal without gallop, murmur, click, rub or other extra sounds. Abdomen:  Bowel sounds positive,abdomen soft and non-tender without masses, organomegaly or hernias noted. Msk:  No deformity or scoliosis noted of thoracic or lumbar spine.  no acute joint changes Pulses:  plus one pedal pulses    Extremities:  trace left pedal edema and trace right pedal edema.   Neurologic:  strength normal in all extremities, sensation intact to light touch, gait normal, and DTRs symmetrical and normal.  (gait is slow and somewhat wide based) no tremor  movements are fluid with no cogwheeling  Skin:  Intact without suspicious lesions or rashes Cervical Nodes:  No lymphadenopathy noted Inguinal Nodes:  No significant adenopathy Psych:  normal affect, talkative and pleasant     Impression & Recommendations:  Problem # 1:  FEVER, HX OF (ICD-V15.9) Assessment New brief fever with weakness for several hours yesterday  now resolved  will check labs  ua shows a few wbc- so sent that for cx (no urinary symptoms) recent cellulitis and MI and also c diff -- all clinically resolved does have some bad dentition- per wife cannot pull teeth in light of blood thinners  adv to call asap if fever returns or other symptoms Orders: Venipuncture (16109) TLB-BMP (Basic Metabolic Panel-BMET) (80048-METABOL) TLB-CBC Platelet - w/Differential (85025-CBCD) T-Culture, Urine ([id number]) Specimen Handling (91478) UA Dipstick W/ Micro (manual) (81000)  Problem # 2:  FATIGUE (ICD-780.79) Assessment: New assoc with above- also resolved at this time see plan above and update if worse  Orders: Venipuncture (29562) TLB-BMP (Basic Metabolic Panel-BMET) (80048-METABOL) TLB-CBC Platelet - w/Differential (85025-CBCD) T-Culture, Urine ([id number]) Specimen Handling (96295) UA Dipstick W/ Micro (manual) (81000)  Problem # 3:  HYPERTENSION (ICD-401.9) Assessment: Unchanged  bp is well controlled had bout of bradycardia- per pt resolved with dec B blocker -- pulse in 50s now and asymptomatic  His updated medication list for this problem includes:    Furosemide 20 Mg Tabs (Furosemide) .Marland Kitchen... 1 tab once daily    Atenolol 25 Mg Tabs (Atenolol) .Marland Kitchen... Take one-half tablet by mouth once a day    Vasotec 10 Mg Tabs  (Enalapril maleate) .Marland Kitchen... Take 1 tablet by mouth once a day  BP today: 100/60 Prior BP: 112/60 (10/11/2009)  Labs Reviewed: K+: 4.4 (10/11/2009) Creat: : 1.6 (10/11/2009)   Chol: 228 (10/07/2006)   HDL: 35.9 (10/07/2006)   LDL: DEL (10/07/2006)   TG: 148 (10/07/2006)  Complete Medication List: 1)  Furosemide 20 Mg Tabs (Furosemide) .Marland Kitchen.. 1 tab once daily 2)  Atenolol 25 Mg Tabs (Atenolol) .... Take one-half tablet by mouth once a day 3)  Plavix 75 Mg Tabs (Clopidogrel bisulfate) .... Take 1 tablet by mouth once a day 4)  Aspirin 325 Mg Tabs (Aspirin) .Marland Kitchen.. 1tab once daily 5)  Nitroglycerin 0.4 Mg Subl (Nitroglycerin) .... One tablet under tongue every 5 minutes as needed for chest pain---may repeat times three 6)  Vitamin D 1000 Unit Tabs (Cholecalciferol) .... Take 1 tablet by mouth once a day 7)  Isosorbide Mononitrate Cr 120 Mg Xr24h-tab (Isosorbide mononitrate) .... Take one tablet by mouth daily 8)  Vitamin B-12 500 Mcg Tabs (Cyanocobalamin) .... Take 1 tablet by mouth once a day 9)  Vasotec 10 Mg Tabs (Enalapril maleate) .... Take 1 tablet by mouth once a day 10)  Fish Oil 1000 Mg Caps (Omega-3 fatty acids) .... Once a day  Patient Instructions: 1)  update me if symptoms return /especially fever - or any new symptoms  2)  we will check blood and also urine today and update  3)  no change in medicines 4)  keep watching pulse rate   Current Allergies (reviewed today): ! PCN ! PRAVACHOL  Laboratory Results   Urine Tests  Date/Time Received: November 05, 2009 11:29 AM  Date/Time Reported: November 05, 2009 11:29 AM   Routine Urinalysis   Color: yellow Appearance: Clear Glucose: negative   (Normal Range: Negative) Bilirubin: negative   (Normal Range: Negative) Ketone: negative   (Normal Range: Negative) Spec. Gravity: 1.010   (Normal Range: 1.003-1.035) Blood: trace-intact   (Normal Range: Negative) pH: 6.0   (Normal Range: 5.0-8.0) Protein: trace   (Normal Range:  Negative) Urobilinogen: 0.2   (Normal Range: 0-1) Nitrite: negative   (Normal Range: Negative) Leukocyte Esterace: trace   (Normal Range: Negative)  Urine Microscopic WBC/HPF: 3-4 RBC/HPF: 0 Bacteria/HPF: mild Mucous/HPF: few Epithelial/HPF: 2-3 Crystals/HPF: few Casts/LPF: 0 Yeast/HPF: 0 Other: 0        Laboratory Results   Urine Tests    Routine Urinalysis   Color: yellow Appearance: Clear Glucose: negative   (Normal Range: Negative) Bilirubin: negative   (Normal Range: Negative) Ketone: negative   (Normal Range: Negative) Spec. Gravity: 1.010   (Normal Range: 1.003-1.035) Blood: trace-intact   (Normal Range: Negative) pH: 6.0   (Normal Range: 5.0-8.0) Protein: trace   (Normal Range: Negative) Urobilinogen: 0.2   (Normal Range: 0-1) Nitrite: negative   (Normal Range: Negative) Leukocyte Esterace: trace   (Normal Range: Negative)  Urine Microscopic WBC/hpf: 3-4 RBC/hpf: 0 Bacteria: mild Mucous: few Epithelial: 2-3 Crystals/LPF: few Casts/LPF: 0 Yeast/HPF: 0 Other: 0

## 2010-10-03 NOTE — Progress Notes (Signed)
Summary: verbal ok for PT  Phone Note From Other Clinic   Caller: Tawny Hopping with [facility]  [phone] Summary of Call: Physical therapist with [facility] called to get verbal ok to continue PT, to extend to twice a week for 2 more weeks.  Verbal ok given. Initial call taken by: Lowella Petties CMA,  October 10, 2009 12:34 PM  Follow-up for Phone Call        that is fine I will sign paperwork when it comes Follow-up by: Judith Part MD,  October 10, 2009 1:43 PM

## 2010-10-03 NOTE — Miscellaneous (Signed)
Summary: [facility],Discharge Summary  [facility],Discharge Summary   Imported By: Beau Fanny 11/13/2009 10:28:39  _____________________________________________________________________  External Attachment:    Type:   Image     Comment:   External Document

## 2010-10-03 NOTE — Progress Notes (Signed)
Summary: yeast infection   Phone Note Call from Patient Call back at [phone]   Caller: Toni Amend from [facility] Call For: [name] Summary of Call: Toni Amend from [facility] [facility] calling about patient. She says that he has the starting of a yeast infection and is [facility] [address] [phone]. Initial call taken by: Melody Comas,  April 18, 2010 2:59 PM  Follow-up for Phone Call        ? I assume a skin infection if thrush or something else, let me know  will put px for nystatin cream on emr for call in  update me if not improved Follow-up by: [name],  April 18, 2010 7:14 PM  Additional Follow-up for Phone Call Additional follow up Details #1::        Left message on machine for patient to call back. Sydell Axon LPN  April 19, 2010 10:39 AM  Left message for Toni Amend with [facility] to call back. Lewanda Rife LPN  April 19, 2010 10:47 AM   Toni Amend  notified as instructed by telephone.  Toni Amend said it did appear to be a yeast skin infection. [facility] as instructed. Lewanda Rife LPN  April 19, 2010 11:21 AM      New/Updated Medications: NYSTATIN 100000 UNIT/GM CREA (NYSTATIN) apply to affected area two times a day as needed for yeast skin infection Prescriptions: NYSTATIN 100000 UNIT/GM CREA (NYSTATIN) apply to affected area two times a day as needed for yeast skin infection  #1 medium x 0   Entered and Authorized by:   [name]   Signed by:   Sydell Axon LPN on 16/06/9603   Method used:   Telephoned to ...       [facility] [phone]* (retail)       [address] [place], [place]  [address]       Ph: [phone] or [phone]       Fax: [phone]   RxID:   [id number]

## 2010-11-11 ENCOUNTER — Other Ambulatory Visit: Payer: Self-pay | Admitting: Cardiology

## 2010-11-11 ENCOUNTER — Encounter: Payer: Self-pay | Admitting: Cardiology

## 2010-11-11 ENCOUNTER — Ambulatory Visit (INDEPENDENT_AMBULATORY_CARE_PROVIDER_SITE_OTHER): Payer: Medicare Other | Admitting: Cardiology

## 2010-11-11 DIAGNOSIS — I10 Essential (primary) hypertension: Secondary | ICD-10-CM

## 2010-11-11 DIAGNOSIS — R609 Edema, unspecified: Secondary | ICD-10-CM | POA: Insufficient documentation

## 2010-11-11 DIAGNOSIS — I259 Chronic ischemic heart disease, unspecified: Secondary | ICD-10-CM

## 2010-11-11 DIAGNOSIS — E7800 Pure hypercholesterolemia, unspecified: Secondary | ICD-10-CM

## 2010-11-11 DIAGNOSIS — I25810 Atherosclerosis of coronary artery bypass graft(s) without angina pectoris: Secondary | ICD-10-CM

## 2010-11-11 LAB — BASIC METABOLIC PANEL
BUN: 26 mg/dL — ABNORMAL HIGH (ref 6–23)
Chloride: 97 mEq/L (ref 96–112)
Creatinine, Ser: 1.6 mg/dL — ABNORMAL HIGH (ref 0.4–1.5)
GFR: 45.24 mL/min — ABNORMAL LOW (ref >60.00)
Potassium: 4.3 mEq/L (ref 3.5–5.1)

## 2010-11-17 LAB — CARDIAC PANEL(CRET KIN+CKTOT+MB+TROPI)
CK, MB: 32.5 ng/mL (ref 0.3–4.0)
CK, MB: 47.5 ng/mL (ref 0.3–4.0)
CK, MB: 64.1 ng/mL (ref 0.3–4.0)
Relative Index: 15.8 — ABNORMAL HIGH (ref 0.0–2.5)
Relative Index: 18.6 — ABNORMAL HIGH (ref 0.0–2.5)
Relative Index: 19.1 — ABNORMAL HIGH (ref 0.0–2.5)
Total CK: 206 U/L (ref 7–232)
Total CK: 249 U/L — ABNORMAL HIGH (ref 7–232)
Troponin I: 8.86 ng/mL (ref 0.00–0.06)

## 2010-11-17 LAB — CK TOTAL AND CKMB (NOT AT ARMC)
CK, MB: 50.6 ng/mL (ref 0.3–4.0)
Relative Index: INVALID (ref 0.0–2.5)
Total CK: 260 U/L — ABNORMAL HIGH (ref 7–232)
Total CK: 426 U/L — ABNORMAL HIGH (ref 7–232)
Total CK: 83 U/L (ref 7–232)

## 2010-11-17 LAB — BASIC METABOLIC PANEL
BUN: 13 mg/dL (ref 6–23)
BUN: 22 mg/dL (ref 6–23)
CO2: 21 mEq/L (ref 19–32)
CO2: 23 mEq/L (ref 19–32)
Calcium: 8.1 mg/dL — ABNORMAL LOW (ref 8.4–10.5)
Calcium: 8.3 mg/dL — ABNORMAL LOW (ref 8.4–10.5)
Calcium: 8.3 mg/dL — ABNORMAL LOW (ref 8.4–10.5)
Chloride: 101 mEq/L (ref 96–112)
Chloride: 104 mEq/L (ref 96–112)
Chloride: 108 mEq/L (ref 96–112)
Creatinine, Ser: 1.08 mg/dL (ref 0.4–1.5)
Creatinine, Ser: 1.39 mg/dL (ref 0.4–1.5)
Creatinine, Ser: 1.49 mg/dL (ref 0.4–1.5)
GFR calc Af Amer: 59 mL/min — ABNORMAL LOW (ref >60)
GFR calc Af Amer: 59 mL/min — ABNORMAL LOW (ref >60)
GFR calc Af Amer: >60 mL/min (ref >60)
GFR calc Af Amer: >60 mL/min (ref >60)
GFR calc non Af Amer: 49 mL/min — ABNORMAL LOW (ref >60)
GFR calc non Af Amer: 49 mL/min — ABNORMAL LOW (ref >60)
GFR calc non Af Amer: 59 mL/min — ABNORMAL LOW (ref >60)
Glucose, Bld: 118 mg/dL — ABNORMAL HIGH (ref 70–99)
Potassium: 3.4 mEq/L — ABNORMAL LOW (ref 3.5–5.1)
Potassium: 3.8 mEq/L (ref 3.5–5.1)
Potassium: 4.4 mEq/L (ref 3.5–5.1)
Sodium: 135 mEq/L (ref 135–145)
Sodium: 136 mEq/L (ref 135–145)
Sodium: 137 mEq/L (ref 135–145)

## 2010-11-17 LAB — URINALYSIS, ROUTINE W REFLEX MICROSCOPIC
Bilirubin Urine: NEGATIVE
Nitrite: NEGATIVE
Specific Gravity, Urine: 1.019 (ref 1.005–1.030)
pH: 5 (ref 5.0–8.0)

## 2010-11-17 LAB — CBC
HCT: 39.2 % (ref 39.0–52.0)
HCT: 40.9 % (ref 39.0–52.0)
HCT: 41.8 % (ref 39.0–52.0)
HCT: 47.8 % (ref 39.0–52.0)
Hemoglobin: 14.2 g/dL (ref 13.0–17.0)
Hemoglobin: 14.4 g/dL (ref 13.0–17.0)
Hemoglobin: 16.9 g/dL (ref 13.0–17.0)
MCHC: 34.2 g/dL (ref 30.0–36.0)
MCHC: 34.7 g/dL (ref 30.0–36.0)
MCV: 90.4 fL (ref 78.0–100.0)
MCV: 90.5 fL (ref 78.0–100.0)
MCV: 90.7 fL (ref 78.0–100.0)
MCV: 91.2 fL (ref 78.0–100.0)
MCV: 91.6 fL (ref 78.0–100.0)
Platelets: 106 10*3/uL — ABNORMAL LOW (ref 150–400)
Platelets: 116 10*3/uL — ABNORMAL LOW (ref 150–400)
Platelets: 123 10*3/uL — ABNORMAL LOW (ref 150–400)
Platelets: 143 10*3/uL — ABNORMAL LOW (ref 150–400)
Platelets: 143 10*3/uL — ABNORMAL LOW (ref 150–400)
RBC: 4.35 MIL/uL (ref 4.22–5.81)
RBC: 4.58 MIL/uL (ref 4.22–5.81)
RBC: 5.03 MIL/uL (ref 4.22–5.81)
RBC: 5.26 MIL/uL (ref 4.22–5.81)
RDW: 13.6 % (ref 11.5–15.5)
RDW: 13.8 % (ref 11.5–15.5)
RDW: 13.9 % (ref 11.5–15.5)
RDW: 14.4 % (ref 11.5–15.5)
WBC: 10.9 10*3/uL — ABNORMAL HIGH (ref 4.0–10.5)
WBC: 24 10*3/uL — ABNORMAL HIGH (ref 4.0–10.5)
WBC: 7 10*3/uL (ref 4.0–10.5)
WBC: 8.7 10*3/uL (ref 4.0–10.5)
WBC: 9.3 10*3/uL (ref 4.0–10.5)

## 2010-11-17 LAB — DIFFERENTIAL
Basophils Relative: 0 % (ref 0–1)
Eosinophils Relative: 0 % (ref 0–5)
Lymphocytes Relative: 5 % — ABNORMAL LOW (ref 12–46)
Lymphs Abs: 0.5 10*3/uL — ABNORMAL LOW (ref 0.7–4.0)
Lymphs Abs: 1.9 10*3/uL (ref 0.7–4.0)
Monocytes Absolute: 1.9 10*3/uL — ABNORMAL HIGH (ref 0.1–1.0)
Monocytes Relative: 6 % (ref 3–12)
Monocytes Relative: 8 % (ref 3–12)
Neutro Abs: 20.2 10*3/uL — ABNORMAL HIGH (ref 1.7–7.7)
Neutro Abs: 9.8 10*3/uL — ABNORMAL HIGH (ref 1.7–7.7)
Neutrophils Relative %: 89 % — ABNORMAL HIGH (ref 43–77)
WBC Morphology: INCREASED

## 2010-11-17 LAB — HEPARIN LEVEL (UNFRACTIONATED)
Heparin Unfractionated: 0.33 IU/mL (ref 0.30–0.70)
Heparin Unfractionated: 0.43 IU/mL (ref 0.30–0.70)
Heparin Unfractionated: 0.51 IU/mL (ref 0.30–0.70)
Heparin Unfractionated: 0.53 IU/mL (ref 0.30–0.70)

## 2010-11-17 LAB — LIPID PANEL
Cholesterol: 166 mg/dL (ref 0–200)
LDL Cholesterol: 111 mg/dL — ABNORMAL HIGH (ref 0–99)
Total CHOL/HDL Ratio: 3.8 RATIO
VLDL: 11 mg/dL (ref 0–40)

## 2010-11-17 LAB — TROPONIN I
Troponin I: 0.77 ng/mL (ref 0.00–0.06)
Troponin I: 13.94 ng/mL (ref 0.00–0.06)

## 2010-11-17 LAB — URINE CULTURE

## 2010-11-17 LAB — ABO/RH: ABO/RH(D): O NEG

## 2010-11-17 LAB — GLUCOSE, CAPILLARY

## 2010-11-17 LAB — URINE MICROSCOPIC-ADD ON

## 2010-11-17 LAB — PROTIME-INR
INR: 1.16 (ref 0.00–1.49)
Prothrombin Time: 14.7 seconds (ref 11.6–15.2)

## 2010-11-17 LAB — POCT CARDIAC MARKERS
CKMB, poc: <1 ng/mL — ABNORMAL LOW (ref 1.0–8.0)
Myoglobin, poc: 113 ng/mL (ref 12–200)

## 2010-11-17 LAB — CROSSMATCH: ABO/RH(D): O NEG

## 2010-11-17 LAB — CULTURE, BLOOD (ROUTINE X 2)

## 2010-11-17 LAB — DIC (DISSEMINATED INTRAVASCULAR COAGULATION)PANEL
Platelets: 139 10*3/uL — ABNORMAL LOW (ref 150–400)
Smear Review: NONE SEEN

## 2010-11-28 NOTE — Assessment & Plan Note (Signed)
Summary: F3M   Visit Type:  3 months follow up Primary Provider:  Judith Part MD  CC:  left leg edema.  History of Present Illness: About the same although did develop a cough,and his wife increased his furosemide to 30 mg per day by mouth.  Overall he has fallen twice since I saw him last.  Denies progressive chest pain.   Had some swelling left lower extremity, but it is better.  Had a cough, beyond the long standing dry cough that he has had with vasotec.     Problems Prior to Update: 1)  Edema  (ICD-782.3) 2)  Accidental Falls, Recurrent  (ICD-E888.9) 3)  Back Pain, Acute  (ICD-724.5) 4)  Accidental Fall From Bed  (ICD-E884.4) 5)  Fever, Hx of  (ICD-V15.9) 6)  Fatigue  (ICD-780.79) 7)  Cad, Artery Bypass Graft  (ICD-414.04) 8)  Hypercholesterolemia  (ICD-272.0) 9)  Ischemic Heart Disease  (ICD-414.9) 10)  Hypertension  (ICD-401.9) 11)  Angina, Hx of  (ICD-V12.50) 12)  Aortic Stenosis  (ICD-424.1) 13)  Atherosclerosis, Coronary, Native Artery  (ICD-414.01) 14)  Vertigo  (ICD-780.4) 15)  Aftercare, Long-term Use, Medications Nec  (ICD-V58.69)  Current Medications (verified): 1)  Furosemide 20 Mg Tabs (Furosemide) .... Take 1 1/2 Tablets Daily 2)  Atenolol 25 Mg  Tabs (Atenolol) .... Take One-Half Tablet By Mouth Once A Day 3)  Plavix 75 Mg  Tabs (Clopidogrel Bisulfate) .... Take 1 Tablet By Mouth Once A Day 4)  Aspirin 325 Mg  Tabs (Aspirin) .Marland Kitchen.. 1tab Once Daily 5)  Nitroglycerin 0.4 Mg Subl (Nitroglycerin) .... One Tablet Under Tongue Every 5 Minutes As Needed For Chest Pain---May Repeat Times Three 6)  Vitamin D 1000 Unit  Tabs (Cholecalciferol) .... Take 1 Tablet By Mouth Once A Day 7)  Isosorbide Mononitrate Cr 120 Mg Xr24h-Tab (Isosorbide Mononitrate) .... Take One Tablet By Mouth Daily 8)  Vitamin B-12 500 Mcg Tabs (Cyanocobalamin) .... Take 1 Tablet By Mouth Once A Day 9)  Vasotec 10 Mg Tabs (Enalapril Maleate) .... Take 1 Tablet By Mouth Once A Day 10)  Fish Oil  1000 Mg Caps (Omega-3 Fatty Acids) .... Once A Day 11)  Amlodipine Besylate 5 Mg Tabs (Amlodipine Besylate) .... Take One-Half  Tablet By Mouth Daily 12)  Nystatin 100000 Unit/gm Crea (Nystatin) .... Apply To Affected Area Two Times A Day As Needed For Yeast Skin Infection  Allergies: 1)  ! Pcn 2)  ! Pravachol 3)  ! Zocor  Vital Signs:  Patient profile:   77 year old male Height:      66 inches Weight:      196.25 pounds BMI:     31.79 Pulse rate:   57 / minute Pulse rhythm:   regular Resp:     18 per minute BP sitting:   112 / 52  (left arm) Cuff size:   large  Vitals Entered By: Vikki Ports (November 11, 2010 12:06 PM)  Physical Exam  General:  Well developed, well nourished, in no acute distress. Head:  normocephalic and atraumatic Eyes:  PERRLA/EOM intact; conjunctiva and lids normal. Neck:  JVP not up. Lungs:  Clear bilaterally to auscultation and percussion. Heart:  PMI non displaced. Normal S1 and S2.  2/6 SEM.  No DM.Marland Kitchen   Abdomen:  Bowel sounds positive; abdomen soft and non-tender without masses, organomegaly, or hernias noted. No hepatosplenomegaly. Pulses:  pulses normal in all 4 extremities Extremities:  No clubbing or cyanosis.  LLE edema 1 plus  (harvest leg)  more than RLE.   Neurologic:  Alert and oriented x 3.   EKG  Procedure date:  11/11/2010  Findings:      SB.  Nonspecific ST and T abnormality.  Impression & Recommendations:  Problem # 1:  CAD, ARTERY BYPASS GRAFT (ICD-414.04) stable at present.  No progressive angina. His updated medication list for this problem includes:    Atenolol 25 Mg Tabs (Atenolol) .Marland Kitchen... Take one-half tablet by mouth once a day    Plavix 75 Mg Tabs (Clopidogrel bisulfate) .Marland Kitchen... Take 1 tablet by mouth once a day    Aspirin 81 Mg Tbec (Aspirin) .Marland Kitchen... Take one tablet by mouth daily    Nitroglycerin 0.4 Mg Subl (Nitroglycerin) ..... One tablet under tongue every 5 minutes as needed for chest pain---may repeat times three     Isosorbide Mononitrate Cr 120 Mg Xr24h-tab (Isosorbide mononitrate) .Marland Kitchen... Take one tablet by mouth daily    Vasotec 10 Mg Tabs (Enalapril maleate) .Marland Kitchen... Take 1 tablet by mouth once a day    Amlodipine Besylate 5 Mg Tabs (Amlodipine besylate) .Marland Kitchen... Take one-half  tablet by mouth daily  Orders: EKG w/ Interpretation (93000) TLB-BMP (Basic Metabolic Panel-BMET) (80048-METABOL)  His updated medication list for this problem includes:    Atenolol 25 Mg Tabs (Atenolol) .Marland Kitchen... Take one-half tablet by mouth once a day    Plavix 75 Mg Tabs (Clopidogrel bisulfate) .Marland Kitchen... Take 1 tablet by mouth once a day    Aspirin 81 Mg Tbec (Aspirin) .Marland Kitchen... Take one tablet by mouth daily    Nitroglycerin 0.4 Mg Subl (Nitroglycerin) ..... One tablet under tongue every 5 minutes as needed for chest pain---may repeat times three    Isosorbide Mononitrate Cr 120 Mg Xr24h-tab (Isosorbide mononitrate) .Marland Kitchen... Take one tablet by mouth daily    Vasotec 10 Mg Tabs (Enalapril maleate) .Marland Kitchen... Take 1 tablet by mouth once a day    Amlodipine Besylate 5 Mg Tabs (Amlodipine besylate) .Marland Kitchen... Take one-half  tablet by mouth daily  Problem # 2:  AORTIC STENOSIS (ICD-424.1) stable.  Needs repeat echo in six months. His updated medication list for this problem includes:    Furosemide 20 Mg Tabs (Furosemide) .Marland Kitchen... Take 1 1/2 tablets daily    Atenolol 25 Mg Tabs (Atenolol) .Marland Kitchen... Take one-half tablet by mouth once a day    Nitroglycerin 0.4 Mg Subl (Nitroglycerin) ..... One tablet under tongue every 5 minutes as needed for chest pain---may repeat times three    Isosorbide Mononitrate Cr 120 Mg Xr24h-tab (Isosorbide mononitrate) .Marland Kitchen... Take one tablet by mouth daily    Vasotec 10 Mg Tabs (Enalapril maleate) .Marland Kitchen... Take 1 tablet by mouth once a day  His updated medication list for this problem includes:    Furosemide 20 Mg Tabs (Furosemide) .Marland Kitchen... Take 1 1/2 tablets daily    Atenolol 25 Mg Tabs (Atenolol) .Marland Kitchen... Take one-half tablet by mouth once a  day    Nitroglycerin 0.4 Mg Subl (Nitroglycerin) ..... One tablet under tongue every 5 minutes as needed for chest pain---may repeat times three    Isosorbide Mononitrate Cr 120 Mg Xr24h-tab (Isosorbide mononitrate) .Marland Kitchen... Take one tablet by mouth daily    Vasotec 10 Mg Tabs (Enalapril maleate) .Marland Kitchen... Take 1 tablet by mouth once a day  Problem # 3:  EDEMA (ICD-782.3) LLE greater than RLE.  Better since furosemide increased.  Continue at 30mg /day.  check  BMET.    Patient Instructions: 1)  Your physician recommends that you have lab work today: BMP 2)  Your  physician has recommended you make the following change in your medication: DECREASE Aspirin to 81mg  once a day 3)  Your physician wants you to follow-up in:  6 MONTHS.  You will receive a reminder letter in the mail two months in advance. If you don't receive a letter, please call our office to schedule the follow-up appointment. 4)  Your physician has requested that you have an echocardiogram in 6 MONTHS.  Echocardiography is a painless test that uses sound waves to create images of your heart. It provides your doctor with information about the size and shape of your heart and how well your heart's chambers and valves are working.  This procedure takes approximately one hour. There are no restrictions for this procedure. Prescriptions: VASOTEC 10 MG TABS (ENALAPRIL MALEATE) Take 1 tablet by mouth once a day Brand medically necessary #30 x 6   Entered by:   Julieta Gutting, RN, BSN   Authorized by:   Ronaldo Miyamoto, MD, [facility]   Signed by:   Julieta Gutting, RN, BSN on 11/11/2010   Method used:   [facility] to        [facility] [phone]* (retail)       [address]       [place], [place]  [address]       Ph: [phone] or [phone]       Fax: [phone]   RxID:   [id number] NITROGLYCERIN 0.4 MG SUBL (NITROGLYCERIN) One tablet under tongue every 5 minutes as needed for chest pain---may repeat times three   #25 x 2   Entered by:   Julieta Gutting, RN, BSN   Authorized by:   Ronaldo Miyamoto, MD, [facility]   Signed by:   Julieta Gutting, RN, BSN on 11/11/2010   Method used:   Electronically to        [facility] [phone]* (retail)       [address]       [place], [place]  [address]       Ph: [phone] or [phone]       Fax: [phone]   RxID:   [id number] ISOSORBIDE MONONITRATE CR 120 MG XR24H-TAB (ISOSORBIDE MONONITRATE) Take one tablet by mouth daily  #30 x 6   Entered by:   Julieta Gutting, RN, BSN   Authorized by:   Ronaldo Miyamoto, MD, [facility]   Signed by:   Julieta Gutting, RN, BSN on 11/11/2010   Method used:   Electronically to        [facility] [phone]* (retail)       [address]       [place], [place]  [address]       Ph: [phone] or [phone]       Fax: [phone]   RxID:   [id number] FUROSEMIDE 20 MG TABS (FUROSEMIDE) take 1 1/2 tablets daily  #45 x 6   Entered by:   Julieta Gutting, RN, BSN   Authorized by:   Ronaldo Miyamoto, MD, [facility]   Signed by:   Julieta Gutting, RN, BSN on 11/11/2010   Method used:   Electronically to        [facility] [phone]* (retail)       [address]  [place], [place]  [address]       Ph: [phone] or [phone]       Fax: [phone]   RxID:   [id number]

## 2010-12-03 ENCOUNTER — Other Ambulatory Visit: Payer: Self-pay | Admitting: Cardiology

## 2010-12-09 LAB — HEPARIN LEVEL (UNFRACTIONATED)
Heparin Unfractionated: 0.56 IU/mL (ref 0.30–0.70)
Heparin Unfractionated: 0.57 IU/mL (ref 0.30–0.70)

## 2010-12-09 LAB — GLUCOSE, CAPILLARY
Glucose-Capillary: 106 mg/dL — ABNORMAL HIGH (ref 70–99)
Glucose-Capillary: 92 mg/dL (ref 70–99)
Glucose-Capillary: 99 mg/dL (ref 70–99)
Glucose-Capillary: 105 mg/dL — ABNORMAL HIGH (ref 70–99)
Glucose-Capillary: 105 mg/dL — ABNORMAL HIGH (ref 70–99)
Glucose-Capillary: 111 mg/dL — ABNORMAL HIGH (ref 70–99)
Glucose-Capillary: 112 mg/dL — ABNORMAL HIGH (ref 70–99)
Glucose-Capillary: 115 mg/dL — ABNORMAL HIGH (ref 70–99)
Glucose-Capillary: 116 mg/dL — ABNORMAL HIGH (ref 70–99)
Glucose-Capillary: 118 mg/dL — ABNORMAL HIGH (ref 70–99)
Glucose-Capillary: 123 mg/dL — ABNORMAL HIGH (ref 70–99)
Glucose-Capillary: 147 mg/dL — ABNORMAL HIGH (ref 70–99)
Glucose-Capillary: 148 mg/dL — ABNORMAL HIGH (ref 70–99)
Glucose-Capillary: 92 mg/dL (ref 70–99)

## 2010-12-09 LAB — APTT: aPTT: 188 seconds — ABNORMAL HIGH (ref 24–37)

## 2010-12-09 LAB — TROPONIN I: Troponin I: 3.63 ng/mL (ref 0.00–0.06)

## 2010-12-09 LAB — CBC
HCT: 41.5 % (ref 39.0–52.0)
HCT: 39.9 % (ref 39.0–52.0)
HCT: 41 % (ref 39.0–52.0)
HCT: 46.1 % (ref 39.0–52.0)
Hemoglobin: 13.6 g/dL (ref 13.0–17.0)
Hemoglobin: 14.4 g/dL (ref 13.0–17.0)
MCHC: 34.5 g/dL (ref 30.0–36.0)
MCHC: 34.2 g/dL (ref 30.0–36.0)
MCHC: 34.3 g/dL (ref 30.0–36.0)
MCHC: 35.2 g/dL (ref 30.0–36.0)
MCV: 92.2 fL (ref 78.0–100.0)
MCV: 91.1 fL (ref 78.0–100.0)
MCV: 91.2 fL (ref 78.0–100.0)
Platelets: 137 10*3/uL — ABNORMAL LOW (ref 150–400)
Platelets: 157 10*3/uL (ref 150–400)
Platelets: 108 10*3/uL — ABNORMAL LOW (ref 150–400)
Platelets: 134 10*3/uL — ABNORMAL LOW (ref 150–400)
Platelets: 146 10*3/uL — ABNORMAL LOW (ref 150–400)
Platelets: 159 10*3/uL (ref 150–400)
RBC: 4.38 MIL/uL (ref 4.22–5.81)
RBC: 4.49 MIL/uL (ref 4.22–5.81)
RBC: 5.02 MIL/uL (ref 4.22–5.81)
RDW: 13.6 % (ref 11.5–15.5)
RDW: 13.9 % (ref 11.5–15.5)
RDW: 13.5 % (ref 11.5–15.5)
RDW: 13.6 % (ref 11.5–15.5)
RDW: 13.6 % (ref 11.5–15.5)
RDW: 13.6 % (ref 11.5–15.5)
WBC: 13.7 10*3/uL — ABNORMAL HIGH (ref 4.0–10.5)
WBC: 8.2 10*3/uL (ref 4.0–10.5)
WBC: 8.7 10*3/uL (ref 4.0–10.5)

## 2010-12-09 LAB — COMPREHENSIVE METABOLIC PANEL
ALT: 20 U/L (ref 0–53)
AST: 45 U/L — ABNORMAL HIGH (ref 0–37)
Albumin: 3.5 g/dL (ref 3.5–5.2)
Calcium: 8.9 mg/dL (ref 8.4–10.5)
Creatinine, Ser: 1.49 mg/dL (ref 0.4–1.5)
GFR calc Af Amer: 55 mL/min — ABNORMAL LOW (ref >60)
Sodium: 134 mEq/L — ABNORMAL LOW (ref 135–145)
Total Protein: 6.6 g/dL (ref 6.0–8.3)

## 2010-12-09 LAB — BASIC METABOLIC PANEL
BUN: 13 mg/dL (ref 6–23)
BUN: 13 mg/dL (ref 6–23)
BUN: 14 mg/dL (ref 6–23)
CO2: 21 mEq/L (ref 19–32)
Calcium: 8.9 mg/dL (ref 8.4–10.5)
Calcium: 8.4 mg/dL (ref 8.4–10.5)
Calcium: 8.9 mg/dL (ref 8.4–10.5)
Chloride: 105 mEq/L (ref 96–112)
Creatinine, Ser: 0.98 mg/dL (ref 0.4–1.5)
Creatinine, Ser: 0.96 mg/dL (ref 0.4–1.5)
Creatinine, Ser: 1.01 mg/dL (ref 0.4–1.5)
GFR calc Af Amer: >60 mL/min (ref >60)
GFR calc non Af Amer: >60 mL/min (ref >60)
GFR calc non Af Amer: >60 mL/min (ref >60)
GFR calc non Af Amer: >60 mL/min (ref >60)
Glucose, Bld: 119 mg/dL — ABNORMAL HIGH (ref 70–99)
Glucose, Bld: 89 mg/dL (ref 70–99)
Potassium: 4.4 mEq/L (ref 3.5–5.1)
Sodium: 134 mEq/L — ABNORMAL LOW (ref 135–145)

## 2010-12-09 LAB — DIFFERENTIAL
Basophils Absolute: 0 10*3/uL (ref 0.0–0.1)
Eosinophils Absolute: 0.1 10*3/uL (ref 0.0–0.7)
Eosinophils Relative: 2 % (ref 0–5)
Lymphocytes Relative: 10 % — ABNORMAL LOW (ref 12–46)
Lymphs Abs: 0.9 10*3/uL (ref 0.7–4.0)
Neutrophils Relative %: 78 % — ABNORMAL HIGH (ref 43–77)

## 2010-12-09 LAB — POCT I-STAT, CHEM 8
BUN: 20 mg/dL (ref 6–23)
Calcium, Ion: 1.11 mmol/L — ABNORMAL LOW (ref 1.12–1.32)
Chloride: 100 mEq/L (ref 96–112)
HCT: 54 % — ABNORMAL HIGH (ref 39.0–52.0)
Sodium: 134 mEq/L — ABNORMAL LOW (ref 135–145)
TCO2: 24 mmol/L (ref 0–100)

## 2010-12-09 LAB — HEMOGLOBIN AND HEMATOCRIT, BLOOD
HCT: 44.4 % (ref 39.0–52.0)
Hemoglobin: 14.8 g/dL (ref 13.0–17.0)

## 2010-12-09 LAB — POCT CARDIAC MARKERS
Myoglobin, poc: 275 ng/mL (ref 12–200)
Troponin i, poc: 0.43 ng/mL (ref 0.00–0.09)

## 2010-12-09 LAB — PROTIME-INR
INR: 1 (ref 0.00–1.49)
Prothrombin Time: 13.7 seconds (ref 11.6–15.2)

## 2010-12-09 LAB — CK TOTAL AND CKMB (NOT AT ARMC)
Relative Index: 11.8 — ABNORMAL HIGH (ref 0.0–2.5)
Total CK: 206 U/L (ref 7–232)

## 2010-12-09 LAB — TSH: TSH: 0.443 u[IU]/mL (ref 0.350–4.500)

## 2010-12-09 LAB — CARDIAC PANEL(CRET KIN+CKTOT+MB+TROPI)
CK, MB: 11.1 ng/mL — ABNORMAL HIGH (ref 0.3–4.0)
Relative Index: 12.1 — ABNORMAL HIGH (ref 0.0–2.5)
Relative Index: 9.3 — ABNORMAL HIGH (ref 0.0–2.5)
Troponin I: 5.03 ng/mL (ref 0.00–0.06)

## 2010-12-09 LAB — BRAIN NATRIURETIC PEPTIDE
Pro B Natriuretic peptide (BNP): 493 pg/mL — ABNORMAL HIGH (ref 0.0–100.0)
Pro B Natriuretic peptide (BNP): 607 pg/mL — ABNORMAL HIGH (ref 0.0–100.0)

## 2010-12-09 LAB — LIPID PANEL
Cholesterol: 234 mg/dL — ABNORMAL HIGH (ref 0–200)
HDL: 32 mg/dL — ABNORMAL LOW (ref >39)

## 2011-01-14 NOTE — Discharge Summary (Signed)
[name], AINSLEY NO.:  [id number]   MEDICAL RECORD NO.:  [id number]          PATIENT TYPE:  INP   LOCATION:  3712                         FACILITY:  [facility]   PHYSICIAN:  Arturo Morton. Riley Kill, MD, FACCDATE OF BIRTH:  05-Sep-1926   DATE OF ADMISSION:  02/09/2009  DATE OF DISCHARGE:  02/15/2009                               DISCHARGE SUMMARY   PRIMARY CARDIOLOGIST:  Maisie Fus D. Riley Kill, MD, [facility]   PRIMARY CARE PHYSICIAN:  Not listed.   PROCEDURES PERFORMED DURING HOSPITALIZATION:  1. Cardiac catheterization completed by Dr. Shawnie Pons on February 12, 2009, revealing apical hypokinesis with an ejection fraction  of      40%, mild to moderate aortic sclerosis, patent internal mammary      artery, severely diseased left anterior descending, occluded      saphenous vein grafts to the acute marginal branch, obtuse      marginal, and posterior descending artery, 90% native circumflex      stenosis with collaterals to the right coronary artery.  2. Successful percutaneous coronary intervention at the insertion of      the saphenous vein graft to diagonal with non-drug-eluting stent.   FINAL DISCHARGE DIAGNOSES.:  1. Non-ST-elevated myocardial infarction.  2. Known history of coronary artery disease.  A:  Status post coronary artery bypass grafting in 1986.  B:  Status post cardiac catheterization as stated above.  C:  Status post percutaneous coronary intervention to the saphenous vein  graft to diagonal using a non-drug-eluting stent.  1. Hematoma of the left arm.  2. Ischemic cardiomyopathy with an ejection fraction of 45%.  3. Hypertension.  4. Hyperlipidemia.  5. Mild aortic stenosis.  6. Cerebrovascular accident in 2006 x2, both of which were mild.   HOSPITAL COURSE:  This 77 year old Caucasian male with above-mentioned  past medical history who presented to the emergency room with chest pain  syndrome with associated new EKG changes.  The patient was  admitted to  rule out myocardial infarction in the setting of acute coronary  syndrome.  The patient complained of substernal chest pressure radiating  bilaterally to arms with mild diaphoresis without associated shortness  of breath.  The patient's EKG revealed biphasic T-wave anteriorly as  well as ST depression inferolaterally.  The patient was started on  heparin and nitroglycerin drip, aspirin and became pain free, admitted  to cycle of cardiac enzymes.   The patient was scheduled for cardiac catheterization the following day  after the patient's cardiac enzymes were cycled.  The patient's enzymes  were found to be elevated with a maximum troponin of 3.63.   The patient underwent cardiac catheterization per Dr. Shawnie Pons as  described above.  Please see Dr. Rosalyn Charters thorough cardiac  catheterization notes for more details.   In the interim, the patient noted pain and swelling in the left arm.  He  had been getting multiple lab draws from that arm and had been on  heparin.  The patient did have a large hematoma noted in the left upper  extremity secondary to multiple blood  draws.  The patient had no  evidence of compact syndrome, ice compresses and elevation was  completed.  Also, the patient did have a venous Doppler study completed  revealing no obvious DVT in the brachial or radial vein.  The patient  was given pain control and compressors to assist with the swelling and  pain control.   The patient was followed by Dr. Riley Kill post procedure after PCI to the  SVG and the patient was found to be asymptomatic.  The patient did have  some mild aortic valve stenosis which was monitored.  The patient's EKG  revealed normal sinus rhythm and sinus bradycardia with some LVH.  Echocardiogram completed on February 09, 1969, revealed an LVEF of 45% which  was less compared to prior echo at 55%.  The patient was moved to  telemetry on third day of admission and began working with  cardiac rehab  walking in the hall and monitoring the patient's blood pressure and  heart rate along with oxygenation.  The patient did well during cardiac  rehab with no recurrences of chest pain.  Blood pressure remained stable  along with heart rate and oxygenation remained stable as well.   On the day of discharge, the patient was seen and examined by Dr. Shawnie Pons where he was found to be stable for discharge.  His furosemide  which he was taking home at 40 mg a day was changed to 20 mg daily at a  lower dose and he is to follow up with Dr. Riley Kill in a previously  scheduled appointment in 1 week.   DISCHARGE LABORATORY DATA:  Hemoglobin 13.4, hematocrit 38.8, white  blood cells 7.6, and platelets 157.  Sodium 139, potassium 4.2, chloride  106, CO2 of 26, glucose 103, BUN 13, and creatinine 0.98.  BNP 607 on  February 13, 2009.   DIAGNOSTIC DATA:  Chest x-ray on February 13, 2009, revealed interval  improvement, partial clearing of edema.   DISCHARGE VITAL SIGNS:  Blood pressure 151/81, pulse 60, O2 sat 97% on  room air, and temperature 97.6.   DISCHARGE MEDICATIONS:  1. Plavix 75 mg daily.  2. Proscar 5 mg daily.  3. Vasotec 10 mg daily.  4. Lovaza 1 g twice a day.  5. Isosorbide 60 mg daily.  6. Aspirin 325 daily.  7. Tenormin 25 mg daily.  8. Colace 100 mg twice a day.  9. Nitroglycerin 0.4 mg sublingual p.r.n. chest pain.  10.Lasix 20 mg daily (new lower dose from 40 mg daily).   ALLERGIES:  PENICILLIN.   FOLLOWUP PLANS AND APPOINTMENTS:  1. The patient is scheduled with a followup appointment with Dr.      Shawnie Pons on February 19, 2009, at 11 a.m.  2. The patient has been given post-cardiac catheterization      instructions with particular emphasis on the right groin site for      evidence of bleeding, hematoma, or infection.  3. The patient has been advised on a low-sodium, heart-healthy diet.  4. The patient is to continue to monitor left arm for increasing       swelling or pain at the site of hematoma along      with monitoring for signs of infection or drainage.  5. The patient has been advised on lower dose of Lasix.   Time spent on the patient to include physician time 30 minutes.      Bettey Mare. Lyman Bishop, NP      Arturo Morton.  Riley Kill, MD, [facility]  Electronically Signed    KML/MEDQ  D:  02/15/2009  T:  02/16/2009  Job:  [id number]

## 2011-01-14 NOTE — Assessment & Plan Note (Signed)
[facility]                            CARDIOLOGY OFFICE NOTE   NAME:LITTLESergio, [name]                       MRN:          [id number]  DATE:06/26/2008                            DOB:          10-08-1926    HISTORY OF PRESENT ILLNESS:  [name] is in for followup.  In general,  he is stable.  He has not been having any increasing chest pain or  shortness of breath.  Despite his advanced disease, he is able to walk  on a daily basis.  He does get a [name] bit of shortness of breath, but  by and large, he has tolerated all of this.   CURRENT MEDICATIONS:  1. Vasotec 10 mg daily.  2. Atenolol 25 mg daily.  3. Lasix 40 mg daily.  4. Proscar 5 mg daily.  5. Plavix 75 mg daily.  6. Aspirin 81 mg daily.  7. B12.  8. Lutein.  9. Fish oil.  10.Vitamin D.  11.Imdur 120 mg daily.   PHYSICAL EXAMINATION:  GENERAL:  He is alert and oriented.  VITAL SIGNS:  Blood pressure is 120/68 and pulse 77.  CARDIAC:  Soft systolic ejection murmur.  No diastolic murmur is  appreciated.  EXTREMITIES:  Without edema.   Electrocardiogram demonstrates normal sinus rhythm.  There are  occasional premature ventricular contractions.   IMPRESSION:  1. Advanced coronary artery disease as a poor operative candidate.  2. Mild aortic valve stenosis.   PLAN:  1. Return to clinic in 6 months.  2. Basic metabolic profile.     Arturo Morton. Riley Kill, MD, [facility]  Electronically Signed    TDS/MedQ  DD: 06/26/2008  DT: 06/27/2008  Job #: [id number]

## 2011-01-14 NOTE — Assessment & Plan Note (Signed)
[facility]                            CARDIOLOGY OFFICE NOTE   RACER, QUAM                       MRN:          [id number]  DATE:05/05/2007                            DOB:          August 04, 1927    Mr. Bos is in for followup.  He gets a Halder dizzy when he stands up  but overall he has gotten along reasonably well.  He has not had  limiting angina although he gets a Butner bit of discomfort in the arms.   CURRENT MEDICATIONS:  1. Vasotek 10 mg daily.  2. Atenolol 25 mg daily.  3. Lasix 40 mg daily.  4. Proscar 5 mg daily.  5. Plavix 75 mg daily.  6. Aspirin 81 mg daily.  7. B-12 daily.  8. Lutein daily.  9. Fish oil daily.  10.Vitamin D daily.  11.Imdur 120 mg daily.   PHYSICAL EXAMINATION:  He is alert and oriented.  The weight is 197  which is down 3 pounds, the blood pressure is 128/72 with a pulse of 62.  There is a soft systolic ejection murmur.  This is compatible with mild  aortic sclerosis.  LUNGS:  Fields actually are clear.  CARDIAC:  Rhythm is regular.  There is trace edema.   His EKG reveals normal sinus rhythm/sinus bradycardia.   IMPRESSION:  1. Extensive 3 vessel disease status post bypass with recurrent angina      pectoris, please see prior notes.  2. Controlled angina on medical therapy.  3. STATIN INTOLERANCE.   PLAN:  1. Continue dual anti-platelet therapy.  2. Check basic metabolic profile.  3. Repeat 2D echo as it has been over 2 years now since that has been      done to re-evaluate aortic valve.     Arturo Morton. Riley Kill, MD, [facility]  Electronically Signed    TDS/MedQ  DD: 05/05/2007  DT: 05/05/2007  Job #: [id number]

## 2011-01-14 NOTE — Assessment & Plan Note (Signed)
[facility]                                 ON-CALL NOTE   NAME:LITTLEMoses, Odoherty                       MRN:          [id number]  DATE:09/08/2009                            DOB:          [birth year]    On September 08, 2008, in the evening I received a page from the answering  service from Mr. Derks wife regarding her concern for her husband's  return of anginal equivalent and left upper extremity pain.  She also  reported that he had been weak earlier in the day, which was a new  symptom and she was concerned as well for elevated blood pressures.  Per  the patient's wife, his pressure normally runs in the 140s-160s, had  been up into the 180s.  She was also concerned that he has taken 1  sublingual nitroglycerin and his left upper extremity pain was not  sufficiently relieved as it usually as.  I informed Ms. Vanderslice, err on  the side of caution, the best course seemed to be to present to the  emergency department for evaluation, given the patient's history of  coronary artery disease.  She then informed me that he had missed his  dose of atenolol that day and wanted to attempt to have him be compliant  with that medication as well as taking that sublingual nitroglycerin,  recheck blood pressure, and then make a decision.  I thought this was a  reasonable course given his history of stable angina that would usually  respond to 1 or 2 sublingual nitroglycerin.  I instructed her to go  ahead and give him his atenolol, give another sublingual nitroglycerin,  recheck his blood pressure in about 15 or 20 minutes, and if it was  still elevated or his symptoms persisted, then she should err on the  side of caution and call EMS to be taken to the [facility] for  further evaluation.  She understood these instructions.  I received a  second page later in the evening just to inform me that they were headed  to [facility] as after taking the above  medications, his blood pressure  had actually elevated and his symptoms persisted.  Per the patient's  daughter, his blood pressure peaked at 212 systolic and low 308M  diastolic.  EMS was on the way at that point and I reiterated my support  of their decision to contact EMS and presents to ED for further  evaluation.     Jarrett Ables, [facility]     MS/MedQ  DD: 09/09/2009  DT: 09/10/2009  Job #: [id number]

## 2011-01-14 NOTE — Cardiovascular Report (Signed)
NAMEGARRIE, WOODIN NO.:  [id number]   MEDICAL RECORD NO.:  [id number]          PATIENT TYPE:  INP   LOCATION:  2918                         FACILITY:  [facility]   PHYSICIAN:  Brandon Morton. Riley Kill, MD, FACCDATE OF BIRTH:  [birth year]   DATE OF PROCEDURE:  02/12/2009  DATE OF DISCHARGE:  02/12/2009                            CARDIAC CATHETERIZATION   INDICATIONS:  [name] is a 77 year old gentleman who presents who is  well known to me.  He underwent revascularization surgery in 1985.  At  that time, he had an internal mammary to the left anterior descending  artery and 4 vein grafts.  At his last catheterization, only 1 vein  graft remained patent, that being the saphenous vein graft to the  diagonal.  His native vessels were severely diseased.  His internal  mammary to the distal LAD demonstrated a patent mammary, but a severely  diseased LAD.  Importantly, the patient also had extensive intracranial  atherosclerosis.  The patient was seen by Surgery and subsequently  turned down.  He has remained stable on medical therapy until recently.  He was noted in the office to have T-wave inversions in the anterior  precordial leads but denied admission at that time.  Subsequent to that,  the patient has developed recurrent chest pain prompting admission to  the hospital with positive troponins.  His activity level has been very  limited.  As a result with positive enzymes and minimal exercise  tolerance, repeat cardiac catheterization was felt to be indicated.  Risks, benefits, and alternatives were discussed with the family and the  patient consented to proceed.  He decided not to pursue admission  previously, however.   PROCEDURES:  1. Left heart catheterization.  2. Selective coronary arteriography.  3. Selective left ventriculography.  4. Saphenous vein graft angiography.  5. Selective left internal mammary angiography.  6. Percutaneous angioplasty and stenting  using a nondrug-eluting      platform of the distal insertion of the vein graft to the diagonal.   DESCRIPTION OF PROCEDURE:  The patient was brought to the  Catheterization Laboratory and prepped and draped in the usual fashion.  I carefully reviewed his old films, and the operative note.  Because of  the intertriginous rash, we used a left groin, and slightly above the  crease however, the location was excellent and noted at the inferior  aspect of the femoral head.  The femoral artery was then entered and a 5-  [place] sheath was initially placed.  We did a diagnostic view of the  left coronary artery.  Because of the previous anatomy, we did not  inject the native right, and we injected only the vein graft that went  into the diagonal.  Following this, a subclavian shot was done that  demonstrated the left internal mammary.  Previously, aortography had  revealed only 1 visible graft and 2 stumps.  We had previously been  unable to find the acute marginal graft.  At the present time, we  discovered a high-grade insertion stenosis at the saphenous vein graft  to the diagonal.  I discussed this with the patient, I also discussed it  with the family.  Given his acceleration of symptoms and also the EKG  changes, it was felt that an attempt to open this would be probably the  best approach.  We discussed the possibility of redo surgery, but given  the patient's age, and functional status and known intracranial  atherosclerosis, it has been felt that he would not be a good candidate.  He has had a known prior brainstem stroke.  Preparations were then made  for percutaneous intervention.  We used the left groin and a 6-French  sheath, a JR-4 guiding catheter with side holes was utilized.  We  initially put down a Prowater wire and unable to cross the lesion.  We  were subsequently successful in crossing the lesion with a BMW wire.  Several dilatations were then performed without complication.   We  upgraded from a 1.5- to 2-mm balloon.  Several dilatations were  performed, and with this, there was recurrent re-narrowing at the lesion  site and unsatisfactory result.  Because of some layered thrombus in the  distal vein graft, we had hoped to potentially avoid placing a stent,  but it was felt it would be necessary, and therefore, a 2.0 x 12 mini-  Vision nondrug-eluting platform was placed at the lesion site.  This was  taken up to 11 atmospheres.  We then took a 0.25 x 8 Apex noncompliant  balloon, and placed that inside the stent and several dilatations done  up to about 7 atmospheres inside the stent in order not to oversize it  at the insertion site.  There was improvement with re-establishment of  TIMI 3 flow.  At the beginning of the procedure, there was TIMI 1 flow  and TIMI 0 flow, then TIMI 3 flow once the vessel was reestablished.  The patient did have arm pain, which was promptly relieved after  placement of the stent.   The femoral sheath was subsequently sewn into place.  He was taken to  the holding area in satisfactory clinical condition.  We were able to  measure a transvalvular gradient using a pigtail catheter.   HEMODYNAMIC DATA:  1. Central aortic pressure was 128/53.  2. LV pressure 140/14.  3. There was about a 12- to 15-mm gradient on pullback across the      aortic valve.   ANGIOGRAPHIC DATA:  1. The left main is calcified, tapers distally but is without critical      narrowing.  2. The left anterior descending artery is basically totally occluded      after several diagonals.  The first diagonal has a 90% stenosis,      then a second 90% stenosis, then bifurcates and both limbs had 90%      stenoses.  The second diagonal has a 90% stenosis.  The saphenous      vein graft to the diagonal has a 95% stenosis, and following      stenting, it was reduced to less than 10% narrowing.  3. The internal mammary to the left anterior descending artery does       remain intact with about 40% ostial narrowing; however, the distal      LAD is a nonfunctional vessel, being basically subtotally occluded      in both directions coming outside the graft.  4. The circumflex has a 95% proximal stenosis, it is similar to the      previous study.  There  is 30-40% narrowing after the takeoff of 2      marginals.  The AV circumflex and the marginals provides      collaterals to the distal right coronary circulation.  The distal      right circulation is known to be occluded as does the vein graft to      this area.  5. The ventriculogram demonstrates an EF in the range of about 45%      with area of severe apical hypokinesis.   CONCLUSIONS:  1. Moderate reduction in left ventricular function with severe apical      hypokinesis.  2. Mild/moderate aortic valve stenosis.  3. Occluded saphenous vein grafts to the acute marginal, the      circumflex marginal, and the distal right circulation.  4. Continued patency of the internal mammary to the LAD with a      severely diseased native left anterior descending.  5. High-grade stenosis at the distal insertion of the saphenous vein      graft to the diagonal with successful percutaneous intervention      using a nondrug-eluting platform.   DISPOSITION:  The patient has been reasonably functional up until  recently.  The LAD and circumflex are fairly calcified, and the  circumflex could be approached if the patient continues to have  symptoms.  It is not ideal, but potentially would provide symptom  relief.  Given his intracranial atherosclerosis, he is not a good  surgical candidate.  We will continue to consider all options.     Brandon Morton. Riley Kill, MD, [facility]  Electronically Signed    TDS/MEDQ  D:  02/12/2009  T:  02/13/2009  Job:  [id number]

## 2011-01-14 NOTE — Assessment & Plan Note (Signed)
[facility]                            CARDIOLOGY OFFICE NOTE   Brandon, Davila                       MRN:          [id number]  DATE:11/09/2007                            DOB:          February 20, 1927    [name] is in for followup.  He really is doing quite well.  He is  not having any ongoing chest pain.  He does have a Gorney bit of arm  discomfort, but he is able to walk for about 30 minutes every morning  and every evening.  He denies any ongoing or active symptoms.  He did  note that when he was switched from Vasotec to a generic, that he did  very very poorly and did well when he was put back on the Vasotec.  This  has been some time since this happened.   MEDICATIONS:  Include:  1. Vasotec 10 mg daily.  2. Atenolol 25 mg daily.  3. Lasix 40 mg daily.  4. Proscar 5 mg daily.  5. Plavix 75 mg daily.  6. Aspirin 81 mg daily.  7. B12 daily.  8. Lutein daily.  9. Fish oil 1200 mg daily.  10.Vitamin D 1000 international units daily.  11.Imdur 220 mg daily.   PHYSICAL EXAMINATION:  GENERAL:  He is alert and oriented, in no  distress.  VITAL SIGNS:  Blood pressure is 140/74, pulse is 51.  LUNGS:  The lung fields are clear.  CARDIAC:  The cardiac rhythm is regular.  There is a soft, systolic  ejection murmur.   Echocardiogram is done today and reveals some motion of the aortic  valve.  Official report is pending.   At the present time, he seems to be doing well.  I will continue him on  his current medical regimen.  I will see him back in followup in 6  months.  Should he have any problems in the interim, he is to call us,  and we will renew his prescriptions.  Fortunately, his symptoms seem to  be under control on a medical regimen.     [name]. Riley Kill, MD, [facility]  Electronically Signed    TDS/MedQ  DD: 12/21/2007  DT: 12/21/2007  Job #: [id number]

## 2011-01-14 NOTE — H&P (Signed)
[name], [name] NO.:  [id number]   MEDICAL RECORD NO.:  [id number]          PATIENT TYPE:  INP   LOCATION:  2112                         FACILITY:  [facility]   PHYSICIAN:  Brandon Doyne, MD    DATE OF BIRTH:  05-30-1927   DATE OF ADMISSION:  02/09/2009  DATE OF DISCHARGE:                              HISTORY & PHYSICAL   PRIMARY CARDIOLOGIST:  Dr. Riley Davila.   CHIEF COMPLAINT:  Chest pain and dynamic EKG changes.   HISTORY OF PRESENT ILLNESS:  A 77 year old white male with a past  medical history significant for coronary disease status post coronary  bypass grafting, who presents for evaluation of chest pain syndrome with  associated new EKG changes.  Historically the patient has undergone  coronary artery bypass grafting in 1986.  At that time he had the LIMA  to the LAD as well as saphenous vein graft to the OM, diagonal and RCA.  More recently in 2006 he had a chest pain syndrome for which he  underwent a cardiac catheterization which showed well-preserved left  ventricular function as well as patency of the LIMA to the LAD, but  there was severe disease of the LAD.  There was patency of the vein  graft to the diagonal #1 which collateralized the distal LAD; however,  there were high-grade stenosis of the vein grafts to the circumflex and  to the right coronary artery.  He was deemed not to be a candidate for  surgical revascularization and a decision was made for medical therapy.   He felt well for approximately the past 4 years until 1 month ago when  he began developing intermittent bilateral arm pain when he exerted  himself.  This evening at 11 p.m. at night he had severe substernal  chest pressure that radiated to the bilateral arms associated with some  mild diaphoresis.  There is no shortness of breath, nausea or syncope.  Because of these symptoms, he called [facility].  An EKG  was performed which showed biphasic T-waves  anteriorly as well as ST  depressions inferolaterally.  He was brought to the emergency  department, where he was started on a heparin drip, given nitroglycerin  drip and aspirin.  Currently he is chest pain-free.   REVIEW OF SYSTEMS:  All systems reviewed and are negative except as  described in the history of present illness.   PAST MEDICAL HISTORY:  1. Coronary disease, status post coronary artery bypass grafting in      1986 as documented above, status post cardiac catheterization as      documented above in 2006.  2. Mild aortic stenosis.  3. Cerebrovascular accident in 2006 twice, both of which were mild.  4. Hypertension.  5. Hyperlipidemia.  6. History of prostate cancer.  7. History of colon cancer.   FAMILY HISTORY:  Positive for coronary disease in his dad.   SOCIAL HISTORY:  The patient lives at home with his wife.  He denies any  tobacco, alcohol or drugs.   ALLERGIES:  PENICILLIN.   MEDICATIONS:  1. Vasotec 10  mg daily.  2. Atenolol 25 mg daily.  3. Lasix 40 mg daily.  4. Proscar 5 mg daily.  5. Plavix 75 mg daily.  6. Aspirin 81 mg daily.  7. Vitamin B12 daily.  8. Vitamin D daily.  9. Fish oil daily.  10.Imdur daily.   PHYSICAL EXAMINATION:  VITAL SIGNS:  Temperature afebrile, blood  pressure is 138/75, pulse 63, respirations 19, oxygen saturation 91% on  room air.  GENERAL:  In no acute distress.  HEENT: Normocephalic, atraumatic.  Pupils equal, round and reactive to  light and accommodation.  Extraocular movements are intact.  Oropharynx  is pink and moist without any lesions.  NECK:  Supple.  No lymphadenopathy.  No jugular venous distention.  No  masses.  CARDIOVASCULAR:  Regular rate and rhythm and a 2/6 systolic murmur heard  at the left upper sternal border.  LUNGS: Clear to auscultation bilaterally with scant bibasilar crackles.  ABDOMEN:  Positive for bowel sounds, protuberant but nontender,  nondistended.  EXTREMITIES:  No clubbing,  cyanosis, or edema.  PERIPHERAL PULSES:  Dorsalis pedis pulses 2+ bilaterally.  2+ Radial  pulses bilaterally and 2+ femoral pulses bilaterally with no bruits.  SKIN:  No rashes.  BACK:  No CVA tenderness.  NEUROLOGIC:  Nonfocal.   PERTINENT LABORATORY DATA:  Hemoglobin 17.2.  BUN 20, creatinine 1.7.  BNP elevated at 493.  Cardiac biomarkers are trending up with a troponin  of 0.13, which treated up to 0.43, which is in the abnormal range.  EKG  shows normal sinus rhythm with T-wave inversions in leads V2 and V3 and  ST depressions in V4 and V5.  These are new when compared to prior EKGs.   ASSESSMENT/PLAN:  [name] is a 77 year old white male with a past  medical history significant for coronary disease, status post coronary  bypass grafting, with recurrent coronary disease both his native vessels  as well as his bypass grafts, who presents with an acute coronary  syndrome.  We will admit the patient to [facility] Cardiology service under  Dr. Rosalyn Davila care.   1. Acute coronary syndrome.  His biomarkers are consistent with a non-      ST segment elevation myocardial infarction.  Currently he is chest      pain-free.  I think that we should maximize his medical therapy for      now.  His last cardiac catheterization in 2006 left [place] options      for percutaneous intervention; however, depending on his clinical      course, we may need to reconsider cardiac catheterization for      possible angioplasty.  However, at this time I think medical      treatment is the way to go.  Because he is not a coronary artery      bypass grafting candidate, we will go ahead and reload him with      Plavix 600 mg, and continue on 75 mg daily.  Continue aspirin 325      mg daily.  Start a heparin drip and nitroglycerin drip for ischemic      control.  Continue the patient's home beta blocker and ACE      inhibitor.  We will start him on Lipitor or 80 mg daily, cycle      cardiac enzymes, check a  fasting lipid profile and check an      echocardiogram.   1. History of mild aortic stenosis by exam.  This does appear just  to      be mild aortic stenosis.  We will check an echocardiogram to      document what his left ventricular function is and the degree of      his valvular disease.   1. Status post cerebrovascular accident.   1. Hyperlipidemia.  Check a fasting profile.  Initiate therapy with      statin.  Continue fish oil.   1. Fluids, electrolytes, and nutrition.  Saline-lock IV fluids, serum      electrolytes are stable, n.p.o.   1. Deep vein thrombosis prophylaxis not indicated as the patient is on      systemic heparin.      Brandon Doyne, MD  Electronically Signed     SJT/MEDQ  D:  02/09/2009  T:  02/09/2009  Job:  [id number]

## 2011-01-17 NOTE — Assessment & Plan Note (Signed)
[facility]                            CARDIOLOGY OFFICE NOTE   NAME:LITTLEVictory, Strollo                       MRN:          [id number]  DATE:12/15/2006                            DOB:          Mar 20, 1927    HISTORY OF PRESENT ILLNESS:  Mr. Gervasi is in for a follow up visit.  In  general, he has been stable.  He has not been having any ongoing  progressive chest discomfort.  He is, however, fairly limited, and he  does get short of breath.  His wife says that when he takes walks two  times a day, he will often have difficulty.  Fortunately, there has not  been limitations significantly with angina.   PHYSICAL EXAMINATION:  VITAL SIGNS:  His current weight is 200 pounds,  blood pressure 120/74, pulse 52.  LUNGS:  Fields are clear.  CARDIAC:  Rhythm is regular without a significant murmur.   STUDIES:  Electrocardiogram demonstrates normal sinus rhythm/sinus  bradycardia, otherwise, within normal limits.   IMPRESSION:  1. Coronary artery disease status post coronary artery bypass graft      surgery 04/12/1984 with a left internal mammary to the LAD,      saphenous vein graft to the diagonal, saphenous vein graft to the      circumflex and saphenous vein graft to the RCA with follow up.      Catheterization revealing patent internal mammary to the LAD and      occlusion of the saphenous vein grafts to the other vessels with      redo bypass thought to be not optimal by Dr. Laneta Simmers and evidence of      extensive intracranial vascular disease.  2. Controlled angina on medical therapy.  3. Limited exercise tolerance, probably due to #1.  4. Statin intolerance with fairly high LDL cholesterol and intolerance      to lipid lowering agents.   RECOMMENDATIONS:  1. Continued medical therapy.  2. Return to clinic in 3-6 months.     Arturo Morton. Riley Kill, MD, [facility]  Electronically Signed    TDS/MedQ  DD: 12/15/2006  DT: 12/16/2006  Job #: [id number]

## 2011-01-17 NOTE — Assessment & Plan Note (Signed)
[facility]                              CARDIOLOGY OFFICE NOTE   JAREE, TRINKA                       MRN:          [id number]  DATE:03/24/2006                            DOB:          [birth year]    Mr. Calender is in for a follow-up visit.  In general, there has been very  Taitt change in his overall symptomatology.  He has not been having a lot  of chest pain.  Overall, he thinks he is doing pretty well.  In the process,  we have managed to get him off of Ranexa.  He has been off of Ranexa for  some time.  Because of his elevated LDL, cholesterol, the plan has been to  reinstituted treatment with lipid lowering agents.  He has not been able to  use Pravachol.  We stopped Zocor largely because of the use of Ranexa.   Physical exam shows a blood pressure 130/81, pulse is 62.  The lung fields  are clear.  The cardiac rhythm is regular.  There is a soft systolic  ejection murmur with no significant diastolic murmurs and no definite  extremity edema.   The electrocardiogram demonstrates ventricular rate of 62, PR interval is  176 msec and the QTC is 444 msec.  The EKG does not show any acute changes.   The patient has multivessel disease but he has remained stable on a medical  regimen.  He has not had significant recurrence of his angina despite  withdrawal of Ranexa.  We have withdrew that because of the inability to use  Zocor and a markedly elevated LDL cholesterol of 212.  Resuming Zocor would  be of worthwhile value at this point and time.  Follow up studies in two  months to assess cholesterol would be helpful.                              Arturo Morton. Riley Kill, MD, [facility]    TDS/MedQ  DD:  04/09/2006  DT:  04/09/2006  Job #:  [id number]

## 2011-01-17 NOTE — Cardiovascular Report (Signed)
[name], [name] NO.:  [id number]   MEDICAL RECORD NO.:  [id number]          PATIENT TYPE:  INP   LOCATION:  3736                         FACILITY:  [facility]   PHYSICIAN:  [name]. [name], M.D. [facility] OF BIRTH:  [birth year]   DATE OF PROCEDURE:  06/12/2005  DATE OF DISCHARGE:                              CARDIAC CATHETERIZATION   INDICATIONS:  [name] is a 77 year old gentleman who presents with chest  pain. He has had some exertional symptoms recently. The patient underwent  revascularization surgery 21 years ago at [facility] in [place]. At  that time, he had an internal mammary to the LAD and saphenous vein grafts  to the diagonal, OM and right coronary artery. He presents now with  recurrent symptoms. Unfortunately, the patient had a stroke in May of 2006  has been on Aggrenox. He had cerebral angiography which demonstrated  complicated intracranial disease.   PROCEDURES:  1.  Left heart catheterization.  2.  Selective coronary arteriography.  3.  Selective left ventriculography.  4.  Subclavian angiography.  5.  Saphenous vein graft angiography x3.  6.  Aortic root aortography.   DESCRIPTION OF PROCEDURE:  The patient was brought to the catheterization  laboratory, prepped and draped in the usual fashion. Through an anterior  puncture, the right femoral artery was entered. A 6-French sheath was  initially placed. Views of the left and right coronary arteries were  obtained in multiple angiographic projections. The coronaries were heavily  calcified. Following this, vein graft angiography was accomplished without  complication. We then used a Glidewire to gain access to the left subclavian  area. The mammary appeared to be small in caliber, so selective injection  was not attempted. Nonselective injection demonstrated the findings.  Following this, the catheter was removed. Pigtail catheter was then placed  in the left ventricle.  Ventriculography was performed in the RAO projection.  Following a pressure pullback, proximal root aortography was performed. I  then spoke with the patient's family, and [name] for possible  consultation.   He was taken to the holding area in satisfactory clinical condition.   HEMODYNAMIC DATA:  1.  Central aortic pressure 142/69, mean 96.  2.  Left ventricular pressure 169/20.  3.  No gradient on pullback across the aortic valve.   ANGIOGRAPHIC DATA:  1.  The left coronary artery is a heavily calcified vessel.  2.  The left main is without significant obstruction.  3.  The left anterior descending artery is heavily calcified and as      previously noted severely diseased. This vessel provides a first      diagonal branch which has severe disease of 90% at the ostium and then      90% in the mid. It is diffusely irregular following this. The second      diagonal also has diffuse 70% narrowing of the LAD; just beyond this is      essentially occluded.  4.  The native circumflex has a 90% ostial stenosis before providing a tiny      marginal and then  a second marginal. There is a very large third      marginal which also provides collaterals to the distal right coronary      circulation. Distal right has flow into both the posterolateral and what      appears to be a posterior descending branch.  5.  The the right coronary artery is basically totally occluded at its      ostium.  6.  The saphenous vein graft to the diagonal is intact. It fills the      diagonal. The diagonal then provides a collateral to what appears to be      the distal left anterior descending artery. The diagonal also fills      retrograde into what is probably the native LAD as well as a native      vessel with a septal perforator that has 90% ostial narrowing. There is      some faint collateralization of the distal right circulation from this      vessel.  7.  The saphenous vein graft to the OM was  occluded.  8.  The saphenous vein graft to the RCA is occluded.  9.  The internal mammary appears to be patent. It is somewhat small in      caliber and flows into what appears to be the midportion of the left      anterior descending artery but the LAD appears occluded beyond the graft      insertion and also appears occluded retrograde from its insertion.      Functionally, the vessel appears to provide a couple of septal      perforating vessels.  10. Ventriculography in the RAO projection reveals overall preserved      systolic function. No definite wall motion abnormalities are identified.      Ejection fraction is at least 55%.  11. The aortic root shot demonstrates what appears to be one patent graft.      There is clearly a stump which represents the RCA graft. No significant      aortic regurgitation is demonstrated.   CONCLUSIONS:  1.  Well-preserved left ventricular function.  2.  No evidence of aortic dissection, with no aortic regurgitation and      evidence of aortic root demonstration of one patent graft.  3.  Likely patency of the internal mammary to the mid left anterior      descending artery, but with severe disease of the left anterior      descending artery both retrograde and antegrade from the graft insertion      site.  4.  Continued patency of saphenous vein graft to the diagonal which then      collateralizes the distal left anterior descending circulation with some      luminal irregularities.  5.  High-grade stenosis of the ostium of the large circumflex system with      occlusion of the saphenous vein graft to the circumflex.  6.  High-grade disease of the first and second diagonal branches.  7.  Occlusion of the saphenous vein graft to the distal right circulation.   DISPOSITION:  This obviously is a case from an anatomic standpoint that from  a long-term standpoint would be best approached with redo revascularization. However, there clearly some  obstacles related to the patient's recent  stroke. It is not optimal for percutaneous intervention, although this  potentially could be approached. The LAD itself is fairly heavily calcified.  The ostium of  the circumflex may or may well not dilate. It supplies a large  circumflex  territory, and also supplies collaterals to the distal right circulation.  Likewise the intermediate is severely diseased. The size of the vessels also  did not appear to be optimal for rotational atherectomy. Continue approach  with medical therapy could also be potentially tried.      [name]. [name], M.D. [facility]  Electronically Signed     TDS/MEDQ  D:  06/12/2005  T:  06/12/2005  Job:  [id number]   cc:   Rosalyn Gess. Norins, M.D. [facility]  [address]  [place]  [place] [address]   CV Laboratory   Patient's medical record   Evelene Davila, M.D.  [address]  [place]  [place] [address]

## 2011-01-17 NOTE — Consult Note (Signed)
[name], SENGER NO.:  [id number]   MEDICAL RECORD NO.:  [id number]          PATIENT TYPE:  INP   LOCATION:  3736                         FACILITY:  [facility]   PHYSICIAN:  Michael L. Reynolds, M.D.DATE OF BIRTH:  1927-01-05   DATE OF CONSULTATION:  06/13/2005  DATE OF DISCHARGE:                                   CONSULTATION   REASON FOR EVALUATION:  Coronary artery disease and cerebrovascular disease  with recent stroke.   HISTORY OF PRESENT ILLNESS:  This is an inpatient consultation evaluation of  this existing [facility] patient, a 77 year old man who  was admitted to the stroke service in May of this year after presenting to  the clinic with a subacute history of sudden vertigo and gait disturbance.  He had a history of a known previous stroke with remote right vertebral  artery occlusion and subsequent PICA territory stroke with symptoms in 2003.  He was admitted and underwent a workup including a cerebral angiogram which  demonstrated an occluded right vertebral artery, a 50% stenosis of the left  vertebral at the vertebrobasilar junction, a 50% left internal carotid  artery stenosis at the bulb, and a 90% right internal carotid artery  stenosis in the petrous cavernous portion.  His vascular prophylaxis at the  time was switched from aspirin and Plavix to Aggrenox, and he was continued  on his medications.  Since that time, the patient has been pretty stable  from a neurologic perspective.  He has gradually increased his walking  ability and gotten back to walking about a half a mile a day, although he  feels a Elison bit unsteady sometimes.  He has not had any further events  that are concerning for stroke.  He was admitted on June 11, 2005 after  experiencing chest pain and underwent cardiac catheterization on June 12, 2005 which demonstrated three-vessel disease.  He was seen by Dr. Laneta Simmers in  consultation who did not feel  that he was an appropriate candidate for  surgical revascularization.  Neurologic consultation was requested for  further considerations regarding his management.   PAST MEDICAL HISTORY:  Remarkable for stroke as above.  He also has a  significant coronary artery disease history with a previous bypass grafting  in 1985 and severe multivessel disease at this time.  He has a history of  hypertension and hyperlipidemia as well as prostatic hypertrophy.   FAMILY/SOCIAL/REVIEW OF SYSTEMS:  As outlined in the admission H&P of  June 11, 2005 and in the cardiothoracic consult note of June 13, 2005,  which I reviewed.   MEDICATIONS PRIOR TO ADMISSION:  1.  Aggrenox.  2.  Vasotec.  3.  Zocor.  4.  Atenolol.  5.  B12.  6.  Calcium with vitamin D.  7.  Lasix.  8.  Tylenol.  9.  Proscar.   PRESENT MEDICATION REGIMEN:  1.  Zocor.  2.  Proscar.  3.  Tylenol.  4.  Vasotec.  5.  Atenolol.  6.  Aspirin.  7.  Plavix.  8.  Colace.   PHYSICAL EXAMINATION:  VITAL SIGNS:  Blood pressure 157/90, pulse 55,  respirations 18, afebrile.  GENERAL:  This a healthy male in no acute distress.  HEAD:  Normocephalic, atraumatic.  Oropharynx benign.  NECK:  Supple without carotid or vertebral bruits.  HEART:  Regular rate and rhythm without murmurs.  NEUROLOGIC EXAM/MENTAL STATUS:  He is awake and alert.  Speech is fluent and  not dysarthric.  Mood is euthymic, and affect is appropriate.  CRANIAL NERVES:  Pupils are equal and reactive.  Extraocular movements are  full without nystagmus.  Visual fields are full to confrontation.  Face,  tongue, and palate move normally and symmetrically.  MOTOR:  Normal bulk and tone, normal strength.  SENSATION:  Intact to light touch in all extremities.  COORDINATION:  Finger-to-nose and heel-to-shin are performed well.  GAIT:  He arises from a chair easily.  His stance is normal.  He is able to  ambulate without difficulty but falls to the right performing  Romberg  maneuver.  Reflexes are 2+ and symmetric.  Toes are downgoing bilaterally.   LABORATORY REVIEW:  Laboratory values per daily notes.  His glucose on  admission was mildly elevated to 119, down to 102 today, otherwise normal  chemistries.  CBC is normal.  He has not had recent neuro imaging.  Cardiac  catheterization results are as noted above.   IMPRESSION:  1.  History of stroke in May of 2006 with considerable intracranial and      extracranial cerebrovascular disease.  He has actually been pretty      stable since May of 2006 and seems to be recovering reasonably well from      his previous stroke.  2.  History of coronary artery disease with recent angina.  He is not felt      to be a candidate for a redo coronary artery bypass graft per Dr.      Laneta Simmers.   PLAN:  He needs ongoing aggressive risk factor control including systolic  blood pressure in a good but not too low range about the 120-130 range with  a maximum of 100 and as low as possible and glucose in a normal range.  He  could be placed back on Aggrenox, although the combination of aspirin and  Plavix would be acceptable, given that  his coronary risks are quite high right now.  I feel that he should exercise  as much as his cardiologist feels it would be appropriate.  He needs ongoing  followup in our office.  Of note, he was seen on June 04, 2005 by Dr.  Pearlean Brownie and Darrol Angel, N.P.  Thank your for the consultation.      Michael L. Thad Ranger, M.D.  Electronically Signed     MLR/MEDQ  D:  06/13/2005  T:  06/14/2005  Job:  [id number]   cc:   Rosalyn Gess. Norins, M.D. [facility]  [address]  [place]  [place] [address]

## 2011-01-17 NOTE — H&P (Signed)
NAMEGILLERMO, POCH NO.:  [id number]   MEDICAL RECORD NO.:  [id number]          PATIENT TYPE:  INP   LOCATION:  3019                         FACILITY:  [facility]   PHYSICIAN:  Casimiro Needle L. Reynolds, M.D.DATE OF BIRTH:  [birth year]   DATE OF ADMISSION:  01/17/2005  DATE OF DISCHARGE:                                HISTORY & PHYSICAL   CHIEF COMPLAINT:  Dizziness.   HISTORY OF PRESENT ILLNESS:  This is one of several [facility] admissions and the first stroke service admission for this 77-year-  old right-handed man who sees Dr. Illene Regulus and Dr. 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  He was seen by Dr. Orlin Hilding for that  back in 2003.  The patient was rereferred to the office today.  According to  the patient and his wife he went out walking on Monday morning and felt  fine, but upon returning from walking his wife says the patient was unsteady  and leaning to the right.  This gradually worsened and then he began to  develop a headache, subjective blurring of his vision, a [place] difficulty  with swallowing, saliva, and complaints of phlegm in his throat as well as  nausea and vomiting.  He went to the emergency room at [facility],  at which time his workup was negative and CT of the head was unremarkable.  He was discharged with a diagnosis of unexplained dizziness and was given  meclizine.  He reports that this helps, but he still has a lot of dizziness.  He has the dizziness even when sitting still, although it bothers him more  when he gets up and around.  He does describe this as a vertiginous  sensation, like the room is spinning.  He also seems to have a Mattes bit of  a headache which he describes as behind his eyes.  His wife notes that he is  having a lot of  trouble walking now.  He used to walk a couple miles a day  but now he is walking entirely with a cane and is stumbling with walking  short distances.  His daughter is concerned that his right side is a Kitt  weaker than his left.  His examination demonstrated findings highly  suspicious for a stroke and he was admitted to [facility] for further work-  up of this problem.   REVIEW OF SYSTEMS:  GENERAL:  Loss of energy, fatigue.  NEUROLOGIC:  Weakness of the legs.  Double vision which started last night.  Headache and  dizziness as above.  GI:  Nausea.  ENT:  Ringing in the ears.  HEME:  Easy  bruising.  PSYCH, GU, CV, SKIN, RESPIRATORY:  All negative.   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  He was subsequently  followed up by Dr. Debby Bud regarding that.  He has a known history of  hypertension and hyperlipidemia as well as a history of coronary artery  disease with bypass surgery in 1985.  He has a history of remote colon  cancer as well as prostate cancer which was treated with seed implants.  Of  note, at time of his work-up for his stroke in 2003 he was found to have a  tiny metallic artifact in the right temporal area adjacent to his brain  which was felt possibly a migration of a prostate seed and for that reason  he is felt unable to have an MRI.   FAMILY HISTORY:  Remarkable for heart disease, hypertension, high blood  pressure.   SOCIAL HISTORY:  He is married and lives with his wife.  He denies use of  tobacco, alcohol, or illicit drugs.  He has been retired since 65.   MEDICATIONS:  1.  Ascription 325 mg daily.  2.  Plavix 75 mg daily.  3.  Zocor 20 mg daily.  4.  Proscar 5 mg daily.  5.  Atenolol 25 mg daily.  6.  Lasix 40 mg daily.  7.  Vasotec 10 mg daily.  8.  Meclizine p.r.n.  9.  Recently  starting Questran.   ALLERGIES:  PENICILLIN.   PHYSICAL EXAMINATION:  VITAL SIGNS:  Height 5 feet 5.5 inches, weight 188,  blood pressure 160/85, pulse 56, respirations 16.  GENERAL:  This is a somewhat ill-appearing man seated in a wheelchair in no  evident distress.  HEENT:  Head:  Cranium is normocephalic, atraumatic.  Oropharynx benign.  NECK:  Supple without carotid bruits.  HEART:  Regular rate and rhythm without murmurs.  CHEST:  Clear to auscultation bilaterally.  ABDOMEN:  Soft and nontender.  NEUROLOGIC:  Mental status:  He is awake and alert.  Speech is slightly  dysarthric, but normal in content.  Recent and remote memory are intact for  history giving.  He has a defect to conversational naming, can repeat a  phrase.  Mood is euthymic and affect appropriate.  Cranial nerves:  Funduscopic examination is benign.  Pupils equal and briskly reactive.  Examination of extraocular movements reveals spontaneous rotatory nystagmus,  fast component clockwise at rest which is somewhat exaggerated by a gaze in  any direction.  Visual fields full to confrontation.  Hearing is intact to  conversational speech.  Facial sensation is intact to pin prick.  Face,  tongue, and palpate move normally and symmetrically.  Motor:  Normal bulk  and tone.  Normal strength all tested extremity muscles.  Sensation intact  to light touch, vibration, pin prick in all extremities.  Coordination:  Rapid movements are performed well.  Perform finger-to-nose pretty well and  heel-to-shin adequately.  Gait:  He arises fully from a chair.  He tends to  lean to the right when he stands.  When he ambulates he walks with a wide  base and tends to stagger to the right.  Reflexes 2+ and symmetric.  Toes  are downgoing.   LABORATORIES:  CT of the head without contrast at [facility] four  days ago is reviewed.  The study does not demonstrate any particular acute abnormality.  I do note the previously  reported metallic infarct.   IMPRESSION:  Subacute brain stem stroke with resultant vertigo and gait  ataxia.  Of note, he had a  posterior circulation event three years ago and  this represents his second such event.  Furthermore, this event occurred  when he was on dual anti-platelet therapy with aspirin and Plavix and  presumably had a pretty good risk factor control.   PLAN:  Will admit to [facility] for further work-up.  Specifically,  he needs a catheter angiogram to assess his posterior circulation given that  he cannot have an MRA and given that this is his second event.  After that,  consideration intervention depending on the findings.  He also needs  physical and occupational therapy and he may need speech therapy as well.  Check stroke laboratories in the morning.  Stroke service to follow.      MLR/MEDQ  D:  01/17/2005  T:  01/17/2005  Job:  [id number]   cc:   Marne A. Milinda Antis, M.D. [facility]   Rosalyn Gess. Norins, M.D. [facility]

## 2011-01-17 NOTE — Discharge Summary (Signed)
NAMERICARD, FAULKNER NO.:  [id number]   MEDICAL RECORD NO.:  [id number]          PATIENT TYPE:  INP   LOCATION:  3736                         FACILITY:  [facility]   PHYSICIAN:  Arturo Morton. Riley Kill, M.D. [facility] OF BIRTH:  06/02/1927   DATE OF ADMISSION:  06/11/2005  DATE OF DISCHARGE:  06/18/2005                                 DISCHARGE SUMMARY   PRIMARY CARE PHYSICIANS:  1.  Primary cardiologist:  Arturo Morton. Riley Kill, M.D.  2.  Primary care physician:  Rosalyn Gess. Norins, M.D.   PRINCIPAL/SECONDARY DIAGNOSES:  1.  Chest pain, acute coronary syndrome with severe native three vessel      disease, and medical therapy recommended.  2.  Status post aortocoronary bypass surgery in 1985 with left internal      mammary artery to left anterior descending, saphenous vein graft to      obtuse marginal, saphenous vein graft to diagonal, saphenous vein graft      to right coronary artery.  3.  Cerebrovascular accident in May 2006.  4.  Preserved left ventricular function.  5.  Family history of coronary artery disease.  6.  Hypertension.  7.  Vertigo.  8.  History of right posterior inferior cerebellar artery infarction in      2003.  9.  Hyperlipidemia.  10. Status post appendectomy, prostate surgery and colonoscopy.   ALLERGIES:  PENICILLIN.   HOSPITAL COURSE:  Mr. Lembke is a 77 year old male with known coronary  artery disease and recent cerebrovascular accident.  He had chest pain with  exertion that was relieved with rest but his symptoms progressed.  He came  to the hospital on June 11, 2005 and was admitted for further evaluation  and treatment.   His cardiac enzymes were negative for myocardial infarction but a cardiac  catheterization was indicated to define his anatomy and this was performed  on June 12, 2005.   The cardiac catheterization showed severe native three vessel with right  coronary artery totaled, the circumflex with 90% stenosis, left  anterior  descending severely diseased with 90% stenosis on diagonal.  The left  internal mammary artery to left anterior descending was patent.  The  supraventricular tachycardia to diagonal was patent.  The saphenous vein  graft to obtuse marginal and saphenous vein graft to right coronary artery  were totaled.  His ejection fraction was well preserved.  Consideration was  given for re-do bypass surgery and CVTS consult was called.   Mr. Willcutt was seen in consult by Dr. Laneta Simmers but after evaluating him,  considering diffuse small vessels and the left anterior descending not being  graftable, Dr. Laneta Simmers felt that re-vascularization would be suboptimal.  He  also felt that the re-do bypass surgery was high risk because of the fact it  was the second bypass surgery and he has had three prior strokes and has  severe intracranial vascular disease.  Dr. Laneta Simmers discussed the situation  with the patient and his family and it was felt that medical therapy was the  best option.  A neurology consult was called to further  evaluate him as  well.   Mr. Tabet was seen by Dr. Thad Ranger who felt that the Aggrenox could be  continued although the combination of aspirin and Plavix would be acceptable  given that his coronary risk was very high.  He felt that his systolic blood  pressure should be in the 120 to 130 range.  He felt that his systolic blood  pressure should not be lower than 100.   Mr. Bircher had some angina in the hospital and he was started on Imdur which  was up-titrated to 90 mg a day and Ranexa at 500 mg b.i.d.  He tolerated  these well.  He was seen by cardiac rehab and ambulated with them.  His  symptoms decreased with rest and he refused sublingual nitroglycerin.  By  June 18, 2005 he was ambulating 320 feet and again had chest pain with  ambulation but his symptoms resolved with rest and no sublingual  nitroglycerin was needed.   Dr. Riley Kill evaluated Mr. Milich and felt that  medical therapy was his best  option.  He needs early follow up and if his symptoms do not improve,  percutaneous intervention can be considered.  For short distances, Mr.  Jablon is ambulating without chest pain or shortness of breath.  He was  evaluated by Dr. Riley Kill and considered stable for discharge on June 18, 2005 with outpatient follow up arranged.   LABORATORY DATA:  Hemoglobin 14.1, hematocrit 41.0, white blood cell count  7.7, platelet count 174,000.  Sodium 138, potassium 4.0, chloride 106, cO2  24, BUN 14, creatinine 1.1, glucose 150.   Chest x-ray with no evidence of acute disease.   DISCHARGE INSTRUCTIONS:   ACTIVITY:  His activity level is to be as tolerated.  The patient was  advised that with a diagnosis of stable angina, cardiac rehabilitation would  be covered by Medicare but they do not wish to pursue this at this time.  They will see how his activity does at home and advise if they wished to  pursue cardiac rehabilitation as an outpatient.   DIET:  He is to stick to a diet that is low in fat and in salt.   FOLLOW UP:  He is to see April Humphrey, NP for Dr. Riley Kill on July 01, 2005 at 2:30 and he is to follow up with Dr. Debby Bud and with Dr. Pearlean Brownie  needed.   DISCHARGE MEDICATIONS:  1.  Proscar 5 mg daily.  2.  Vasotec 10 mg daily.  3.  Atenolol 25 mg daily.  4.  Coated aspirin 325 mg daily.  5.  Colace 100 mg one to two tablets daily PRN.  6.  Plavix 75 mg daily.  7.  Aggrenox is to be stopped.  8.  Lasix 40 mg daily.  9.  Ranexa 500 mg b.i.d.  10. Imdur 30 mg three tablets daily.  11. Zocor 10 mg daily.  12. Nitroglycerin sublingual PRN.   Duration of discharge encounter 42 minutes.      Theodore Demark, P.A. [facility]      Thomas D. Riley Kill, M.D. [facility]  Electronically Signed    RB/MEDQ  D:  06/18/2005  T:  06/18/2005  Job:  [id number]   cc:   Rosalyn Gess. Norins, M.D. [facility]  [address]  [place] [address]   Pramod P. Pearlean Brownie, MD   Fax: [phone]   Evelene Croon, M.D.  [address]  [place]  [place] [address]

## 2011-01-17 NOTE — Assessment & Plan Note (Signed)
[facility]                              CARDIOLOGY OFFICE NOTE   GARRET, TEALE                       MRN:          [id number]  DATE:05/20/2006                            DOB:          [birth year]    [name] is in for a follow-up visit.  Overall, except for the summer  heat, he has been getting along reasonably well.  He has not been having any  tightness in the chest or worsening shortness of breath.  Overall, he feels  relatively well.  His wife says that he has problems when his Zocor is  raised to 40 mg.  He has been bruising over the arms quite a bit.   MEDICATIONS:  1. Vasotec 10 mg daily.  2. Lasix 40 mg daily.  3. Atenolol 25 mg daily.  4. Plavix 75 mg daily.  5. Enteric-coated aspirin 325 mg daily.  6. Simvastatin 20 mg daily.  7. Imdur 60 mg x2 or 120 mg daily.   PHYSICAL EXAMINATION:  VITAL SIGNS:  Weight is 195 pounds.  Blood pressure  128/80, pulse 68.  LUNGS:  Lung fields are clear without rales.  HEART:  PMI is nondisplaced.  Cardiac rhythm is regular without a  significant murmur.   IMPRESSION:  1. Coronary artery disease, status post coronary artery bypass graft      surgery in 1985 with internal mammary to the left anterior descending,      saphenous vein graft to the obtuse marginal, saphenous branch to the      diagonal, saphenous vein graft to the right coronary artery.  2. History of cerebrovascular accident in May, 2006.  3. History of right posterior inferior cerebellar artery infarction in      2003.  4. Hypertension.  5. Hyperlipidemia.  6. Cardiac catheterization, June 12, 2005, demonstrating an intact      saphenous vein graft to the diagonal, intact internal mammary to the      LAD, although small in flow to the mid portion, with occluded vein      grafts to the obtuse marginal and RCA and total occlusion of the right      coronary artery with collaterals from the native circumflex and well  preserved overall LV function, felt to be a poor redo candidate by Dr.      Laneta Simmers.   PLAN:  1. Lipid and liver profile.  2. Given the extensive bruising on the arm and the fact that he has      continued on Plavix      without recurrent symptoms, we will recommended decreasing his aspirin      to 81 mg daily to modify the risk of bleed.  I will see him back in      followup in three months.                              Arturo Morton. Riley Kill, MD, [facility]    TDS/MedQ  DD:  05/20/2006  DT:  05/21/2006  Job #:  [id number]

## 2011-01-17 NOTE — Assessment & Plan Note (Signed)
[facility]                            CARDIOLOGY OFFICE NOTE   Davila, [name]                       MRN:          [id number]  DATE:09/07/2006                            DOB:          [birth year]    HISTORY OF PRESENT ILLNESS:  [name] is in for follow up.  He has a  Baranowski bit of shortness of breath that happened a few days after  Christmas.  He was eating Honey Baked Ham with similar type of items at  that time.  He now is feeling somewhat better.  He denies any chest  pains since coming off Ranexa.  He has not had significant angina.  After going back on the statins, he had discomfort in his legs  associated with walking.  This has not improved appreciably since the  time of discontinuation.  Of note, his LDL cholesterol was 99.   PHYSICAL EXAMINATION:  VITAL SIGNS:  Blood pressure 148/77, pulse 48.  LUNGS:  Fields are clear.  CARDIAC:  Regular rhythm.  EXTREMITIES:  Femoral pulses that are intact without significant bruits.  Popliteal pulses also feel intact.  There is a palpable 1+ pulse in the  posterior tibialis as well as dorsalis pedis on the right and no  palpable on the left with diminished capillary refill.   STUDIES:  Electrocardiogram demonstrates sinus bradycardia.  Otherwise,  unremarkable.   IMPRESSION:  1. Multivessel coronary artery disease with prior coronary artery      bypass graft surgery and recurrent disease.  2. Controlled angina on medical therapy.  3. Probable peripheral vascular disease  4. Hypercholesterolemia.  5. Stroke.   PLAN:  1. We will get peripheral Doppler studies.  2. Continue medical therapy at the present time.  3. Get a lipid liver profile.  If LDL is over 100, we will consider a      different agent such as Pravachol.  The patient did not tolerated      Zocor.     Arturo Morton. Riley Kill, MD, [facility]  Electronically Signed    TDS/MedQ  DD: 09/07/2006  DT: 09/07/2006  Job #: [id number]

## 2011-01-17 NOTE — Discharge Summary (Signed)
[name], [name]                ACCOUNT NO.:  [id number]   MEDICAL RECORD NO.:  [id number]          PATIENT TYPE:  INP   LOCATION:  3019                         FACILITY:  [facility]   PHYSICIAN:  Pramod P. Pearlean Brownie, MD    DATE OF BIRTH:  [birth year]   DATE OF ADMISSION:  01/17/2005  DATE OF DISCHARGE:  01/20/2005                                 DISCHARGE SUMMARY   PRIMARY CARE PHYSICIAN:  Illene Regulus, M.D.   ADMITTING DIAGNOSES:  Vertigo.   DISCHARGE DIAGNOSES:  1.  Small brain stem infarct not visualized on CT scan.  2.  Severe intracranial occlusive atherosclerotic disease.  3.  Right posterior inferior cerebellar artery infarction in 2003.  4.  Hyperlipidemia.  5.  Hypertension.  6.  Ischemic heart disease.   [name] is a 77 year old Caucasian male who was admitted with one-week  history of dizziness, gait ataxia, diplopia.  He had previous history of  right posterior inferior cerebellar artery infarction in 2003 from which he  had done quite well.  He had been on aspirin and Plavix for secondary stroke  prevention for that.  His other vascular risk factors include hypertension,  hyperlipidemia, and coronary artery disease which were stable.  On  examination he was found to have mild gait ataxia and diplopia on __________  eye movements.  Kindly see Dr. Jacki Cones' H&P for details.  Patient  underwent CT scan of the head on admission which did not reveal any acute  abnormalities.  Because of concerns about intracranial occlusive disease  patient underwent diagnostic four vessel cerebral angiogram on Jan 17, 2005  by Dr. Corliss Skains which showed occluded right vertebral artery at the C2  portion with severe proximal stenosis with slow flow.  This was felt to be  an old finding and the reason for patient's previous stroke in 2003.  There  was also 90% stenosis of right internal carotid artery at the petrous  cavernous junction and 50-55% stenosis of left internal carotid  artery at  the carotid bifurcation.  There was 50% stenosis of the left vertebral  basilar junction also.  The rest of the laboratories revealed a normal  metabolic panel, coagulation laboratories, CBC count.  Homocysteine was  normal at 11.64.  Cholesterol was 181, triglycerides 99, LDL elevated at  119.  Patient was seen on consultation by physical therapy.  However  initially recommended outpatient therapy then suggesting short  rehabilitation stay.  Patient was seen in consultation by Dr. Riley Kill from  rehabilitation whose opinion was that patient was stable enough to go home  and could benefit from outpatient physical therapy which was arranged.  Patient had been on aspirin and Plavix and this was stopped and he was  switched to Aggrenox as secondary stroke prevention.  Patient was advised to  take one capsule a day for the first two weeks and if this is tolerated to  twice a day.   DISCHARGE MEDICATIONS:  1.  Aggrenox once a day for two weeks and then increase to twice a day.  2.  Tenormin 25 mg daily.  3.  Lasix 40 mg daily.  4.  Vasotec 10 mg daily.  5.  Zocor 20 mg once a day.  6.  Proscar 5 mg at bedtime.  7.  Restoril 50 mg at bedtime as needed.   DISCHARGE INSTRUCTIONS:  Outpatient physical therapy at [facility] in  [place].  Outpatient follow-up with primary physician, Dr. Illene Regulus  in one week and Dr. Pearlean Brownie in his office in two months.      PPS/MEDQ  D:  01/20/2005  T:  01/20/2005  Job:  [id number]   cc:   Rosalyn Gess. Norins, M.D. [facility]

## 2011-04-20 ENCOUNTER — Other Ambulatory Visit: Payer: Self-pay | Admitting: Cardiology

## 2011-05-21 ENCOUNTER — Encounter: Payer: Self-pay | Admitting: Cardiology

## 2011-05-22 ENCOUNTER — Other Ambulatory Visit (HOSPITAL_COMMUNITY): Payer: Self-pay | Admitting: Radiology

## 2011-05-22 ENCOUNTER — Ambulatory Visit (INDEPENDENT_AMBULATORY_CARE_PROVIDER_SITE_OTHER): Payer: Medicare Other | Admitting: Cardiology

## 2011-05-22 ENCOUNTER — Ambulatory Visit (HOSPITAL_COMMUNITY): Payer: Medicare Other | Attending: Cardiology

## 2011-05-22 ENCOUNTER — Encounter: Payer: Self-pay | Admitting: Cardiology

## 2011-05-22 VITALS — BP 120/60 | HR 59 | Ht 66.0 in

## 2011-05-22 DIAGNOSIS — E669 Obesity, unspecified: Secondary | ICD-10-CM | POA: Insufficient documentation

## 2011-05-22 DIAGNOSIS — I350 Nonrheumatic aortic (valve) stenosis: Secondary | ICD-10-CM

## 2011-05-22 DIAGNOSIS — I25810 Atherosclerosis of coronary artery bypass graft(s) without angina pectoris: Secondary | ICD-10-CM

## 2011-05-22 DIAGNOSIS — I359 Nonrheumatic aortic valve disorder, unspecified: Secondary | ICD-10-CM

## 2011-05-22 DIAGNOSIS — I080 Rheumatic disorders of both mitral and aortic valves: Secondary | ICD-10-CM | POA: Insufficient documentation

## 2011-05-22 DIAGNOSIS — R609 Edema, unspecified: Secondary | ICD-10-CM

## 2011-05-22 DIAGNOSIS — E7800 Pure hypercholesterolemia, unspecified: Secondary | ICD-10-CM | POA: Insufficient documentation

## 2011-05-22 DIAGNOSIS — I2510 Atherosclerotic heart disease of native coronary artery without angina pectoris: Secondary | ICD-10-CM

## 2011-05-22 DIAGNOSIS — R5381 Other malaise: Secondary | ICD-10-CM | POA: Insufficient documentation

## 2011-05-22 DIAGNOSIS — I379 Nonrheumatic pulmonary valve disorder, unspecified: Secondary | ICD-10-CM | POA: Insufficient documentation

## 2011-05-22 DIAGNOSIS — I10 Essential (primary) hypertension: Secondary | ICD-10-CM | POA: Insufficient documentation

## 2011-05-22 DIAGNOSIS — R5383 Other fatigue: Secondary | ICD-10-CM | POA: Insufficient documentation

## 2011-05-22 LAB — BASIC METABOLIC PANEL
BUN: 19 mg/dL (ref 6–23)
Calcium: 9.2 mg/dL (ref 8.4–10.5)
Creatinine, Ser: 1.1 mg/dL (ref 0.4–1.5)
GFR: 70.32 mL/min (ref >60.00)
Glucose, Bld: 92 mg/dL (ref 70–99)
Sodium: 135 mEq/L (ref 135–145)

## 2011-05-22 NOTE — Assessment & Plan Note (Signed)
- 

## 2011-05-22 NOTE — Assessment & Plan Note (Signed)
Not on statin at the present time.

## 2011-05-22 NOTE — Assessment & Plan Note (Signed)
Non current at present time

## 2011-05-22 NOTE — Assessment & Plan Note (Signed)
This is somewhat worse by numbers.  He is not a good operative candidate, as evidenced by prior admissions.  He might be a TAVR candidate, but he does have extensive CAD.  Would continue his current treatment, and we will reassess in four months.

## 2011-05-22 NOTE — Progress Notes (Signed)
HPI:  Actually doing pretty well.  No real chest pain but not real active.  He has a bit of congestion from time to time, with dry cough.  They try to keep him from talking when he eats.  No orthopnea.    Current Outpatient Prescriptions  Medication Sig Dispense Refill  . amLODipine (NORVASC) 5 MG tablet Take 2.5 mg by mouth daily.        Marland Kitchen aspirin 81 MG tablet Take 81 mg by mouth daily.        Marland Kitchen atenolol (TENORMIN) 25 MG tablet Take 1/2 tablet daily       . cholecalciferol (VITAMIN D) 1000 UNITS tablet Take 1,000 Units by mouth daily.        . clopidogrel (PLAVIX) 75 MG tablet Take 75 mg by mouth daily.        . cyanocobalamin 500 MCG tablet Take 500 mcg by mouth daily.        . furosemide (LASIX) 20 MG tablet Take 1 1/2 tablet daily       . isosorbide mononitrate (IMDUR) 120 MG 24 hr tablet Take 120 mg by mouth daily.        . nitroGLYCERIN (NITROSTAT) 0.4 MG SL tablet Place 0.4 mg under the tongue every 5 (five) minutes as needed.        . Omega-3 Fatty Acids (FISH OIL) 1000 MG CAPS Take 1 capsule by mouth daily.        Marland Kitchen VASOTEC 10 MG tablet TAKE 1 TABLET BY MOUTH ONCE A DAY  90 tablet  1    Allergies  Allergen Reactions  . Penicillins     REACTION: rash  . Pravastatin Sodium     REACTION: tongue swelled  . Simvastatin     Past Medical History  Diagnosis Date  . Coronary artery disease   . Hypercholesterolemia   . Ischemic heart disease   . Hypertension   . Aortic stenosis   . Coronary atherosclerosis of native coronary artery   . Vertigo   . SOB (shortness of breath)   . Swelling of limb     Past Surgical History  Procedure Date  . Coronary artery bypass graft 1985    extensive 3 vessel disease  . Radium seed implant 1998  . Hemicolectomy     Family History  Problem Relation Age of Onset  . Other      Remarkable for heart disease, HTN and high blood pressure    History   Social History  . Marital Status: Married    Spouse Name: N/A    Number of Children:  N/A  . Years of Education: N/A   Occupational History  . retired     since 1987   Social History Main Topics  . Smoking status: Never Smoker   . Smokeless tobacco: Not on file  . Alcohol Use: No  . Drug Use: No  . Sexually Active: Not on file   Other Topics Concern  . Not on file   Social History Narrative  . No narrative on file    ROS: Please see the HPI.  All other systems reviewed and negative.  PHYSICAL EXAM:  BP 120/60  Pulse 59  Ht 5\' 6"  (1.676 m)  General: Well developed, well nourished, in no acute distress. Head:  Normocephalic and atraumatic. Neck: no JVD Lungs: Clear to auscultation and percussion. Heart: Normal S1 and S2. 4/6 mid peaking SEM.  No diastolic murmur..  Abdomen:  Normal bowel sounds; soft;  non tender; no organomegaly Pulses: Pulses normal in all 4 extremities. Extremities: No clubbing or cyanosis. No edema. Neurologic: Alert and oriented x 3.  EKG:  SB. Septal MI, old. ECHO:  Study Conclusions    - Left ventricle: There is moderately severe hypokinesis of     the inferior wall and the septum (anterior septum and     inferior septum). The cavity size was normal. Wall     thickness was increased in a pattern of moderate LVH. The     estimated ejection fraction was 45%. Doppler parameters     are consistent with high ventricular filling pressure.   - Aortic valve: There is significant calcification of the     aortic valve. There is moderately severe aortic stenosis.     Trivial regurgitation. Mean gradient: 34mm Hg (S). Peak     gradient: 58mm Hg (S).   - Mitral valve: Mildly thickened leaflets .   - Left atrium: The atrium was moderately to severely     dilated.   - Impressions: I am not able to find any prior images on the     computer at this time.   Impressions:    - I am not able to find any prior images on the computer at     this time.   ASSESSMENT AND PLAN:

## 2011-05-22 NOTE — Patient Instructions (Signed)
Your physician recommends that you have lab work today: [facility]  Your physician recommends that you continue on your current medications as directed. Please refer to the Current Medication list given to you today.  Your physician wants you to follow-up in: 4 MONTHS.  You will receive a reminder letter in the mail two months in advance. If you don't receive a letter, please call our office to schedule the follow-up appointment.

## 2011-05-22 NOTE — Assessment & Plan Note (Signed)
He has not had an increase in his angina as of late.  Therefore, no changes.  See cath note in overview from 2011 study.  SVG to diag is open, LIMA occluded, and RCA collateralized along with distal LAD.  Continue medical therapy.

## 2011-06-03 ENCOUNTER — Other Ambulatory Visit: Payer: Self-pay | Admitting: Cardiology

## 2011-06-04 ENCOUNTER — Other Ambulatory Visit: Payer: Self-pay

## 2011-06-04 MED ORDER — ISOSORBIDE MONONITRATE ER 120 MG PO TB24: 120.0000 mg | ORAL_TABLET | Freq: Every day | ORAL | Status: DC

## 2011-06-24 ENCOUNTER — Other Ambulatory Visit: Payer: Self-pay | Admitting: Cardiology

## 2011-06-25 ENCOUNTER — Other Ambulatory Visit: Payer: Self-pay | Admitting: Cardiology

## 2011-07-03 ENCOUNTER — Ambulatory Visit (INDEPENDENT_AMBULATORY_CARE_PROVIDER_SITE_OTHER): Payer: Medicare Other

## 2011-07-03 DIAGNOSIS — Z23 Encounter for immunization: Secondary | ICD-10-CM

## 2011-08-11 ENCOUNTER — Telehealth: Payer: Self-pay | Admitting: Cardiology

## 2011-08-11 NOTE — Telephone Encounter (Signed)
I spoke with the pt's wife and she said that the pt has become noticeably weak in legs.  The pt is starting to have difficulty just getting up to go to the restroom.  The pt uses a walker around the office and when he comes into our office he is in a wheelchair.  I recommended that the pt try to get up and move around the house when able to try and increase muscle strength.  I also recommended that the pt contact PCP to see if physical therapy may be needed.  Wife agreed with plan.  (This pt is not taking a statin)

## 2011-08-11 NOTE — Telephone Encounter (Signed)
New Msg: Pt daughter calling stating that pt has been having leg weakness for the past week when gets up from chair. Once pt starts walking it gets better but when he first gets up he has a lot of weakness. Please return pt daughter and/or wife (June) call to discuss further.

## 2011-08-29 ENCOUNTER — Telehealth: Payer: Self-pay

## 2011-08-29 MED ORDER — PLAVIX 75 MG PO TABS: 75.0000 mg | ORAL_TABLET | Freq: Every day | ORAL | Status: DC

## 2011-08-29 NOTE — Telephone Encounter (Signed)
Dr Trudie Reed (OB-GYN in [place])  the pt's father in law called with concerns about the patient.  He states that the pt has generalized weakness in arms and legs bilaterally, decreased urination, the pt has fallen one time, mild increase in body temperature and worsening slurred speech.  He denies that the pt has symptoms of TIA or CVA.  He feels like the pt's symptoms are caused by generic plavix.  He has researched this medication and many of the symptoms the pt has can be related to clopidogrel. He also states that many of the pt's symptoms started around the time he started taking clopidogrel which was 06/24/11. He would like the pt switched back to brand name plavix. The pt [facility]  Rx sent to pharmacy.  I will forward this information to Dr Riley Kill to make him aware of my conversation with Dr Elliot Gault.

## 2011-08-30 ENCOUNTER — Other Ambulatory Visit: Payer: Self-pay | Admitting: Cardiology

## 2011-09-03 NOTE — Telephone Encounter (Signed)
.   Requested Prescriptions   Pending Prescriptions Disp Refills  . isosorbide mononitrate (IMDUR) 120 MG 24 hr tablet [Pharmacy Med Name: ISOSORBIDE MN ER 120 MG TAB] 30 tablet 6    Sig: TAKE 1 TABLET EVERY DAY  . atenolol (TENORMIN) 25 MG tablet [Pharmacy Med Name: ATENOLOL 25 MG TABLET] 90 tablet 6    Sig: TAKE 1 TABLET EVERY DAY

## 2011-09-08 IMAGING — CR DG ANKLE COMPLETE 3+V*R*
3 series · 3 of 3 positions shown · non-contrast
Comparison: None

CLINICAL DATA: Lumbar and ankle pain.  The patient fell today.

RIGHT ANKLE - COMPLETE 3+ VIEW

[view not recorded (1 of 3)]
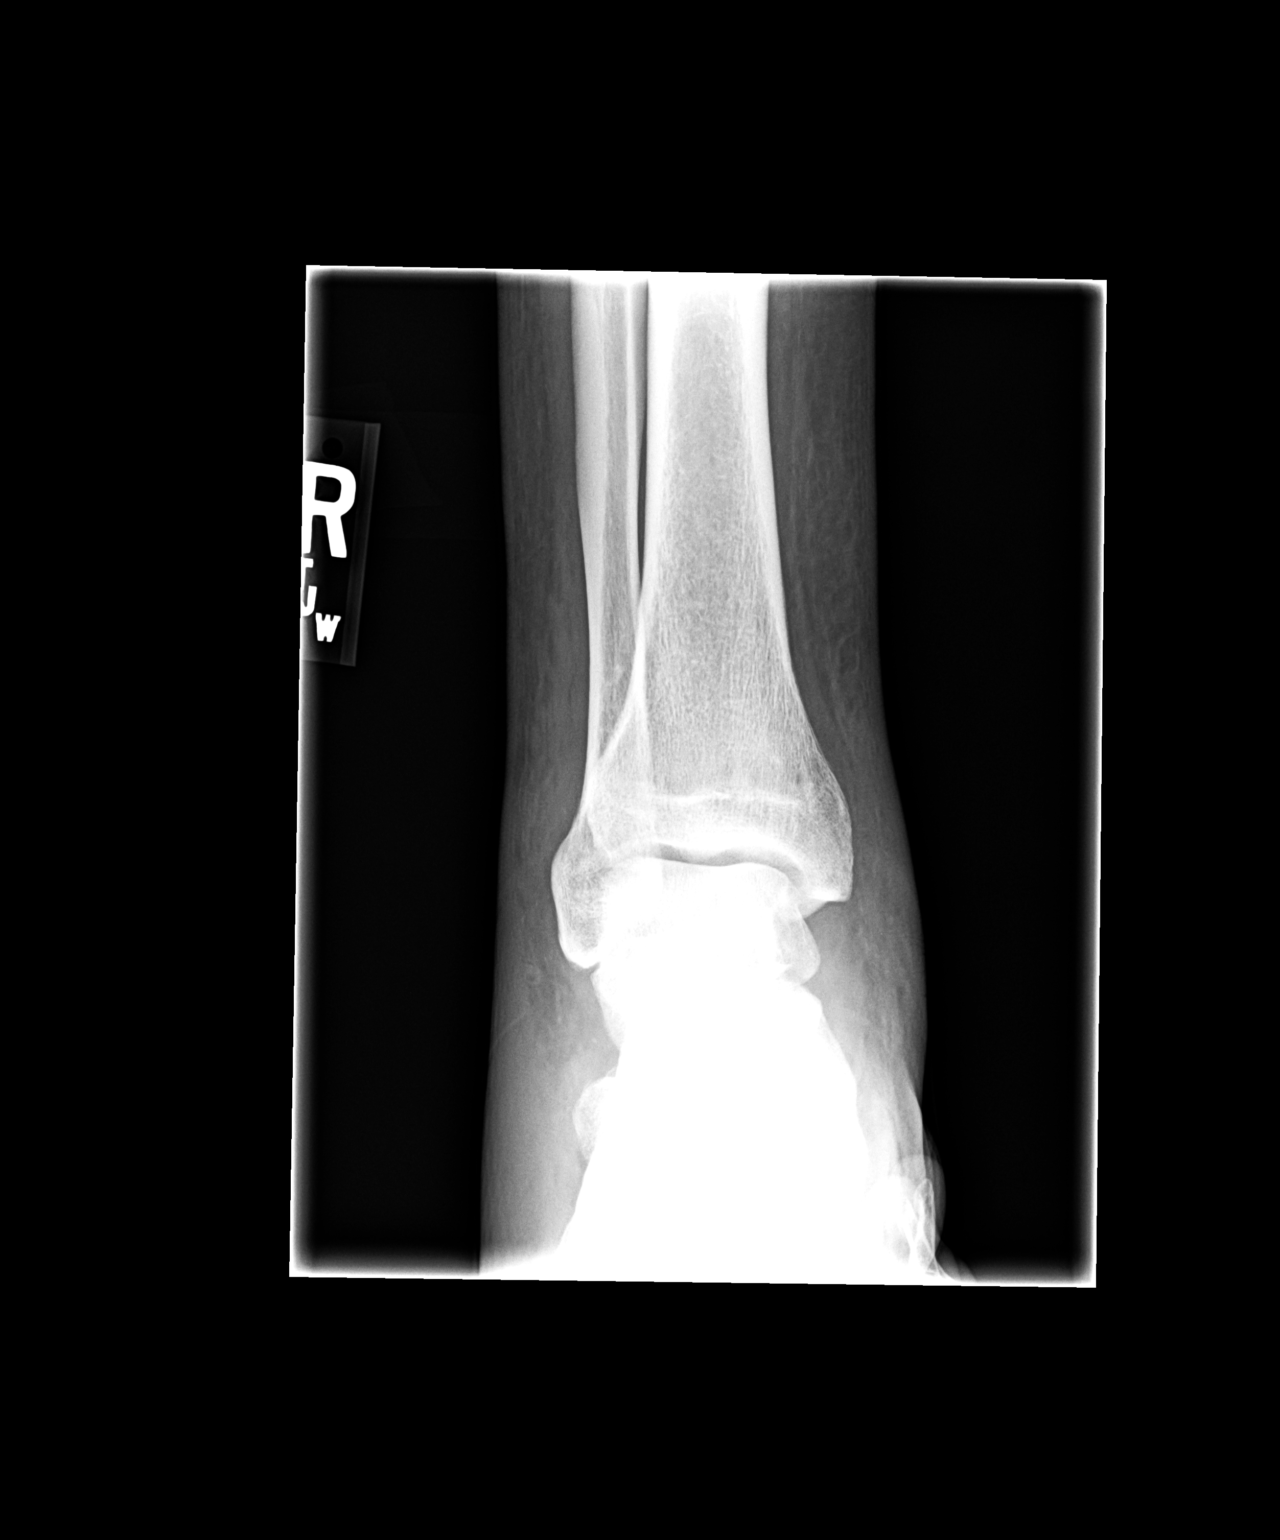

[view not recorded (2 of 3)]
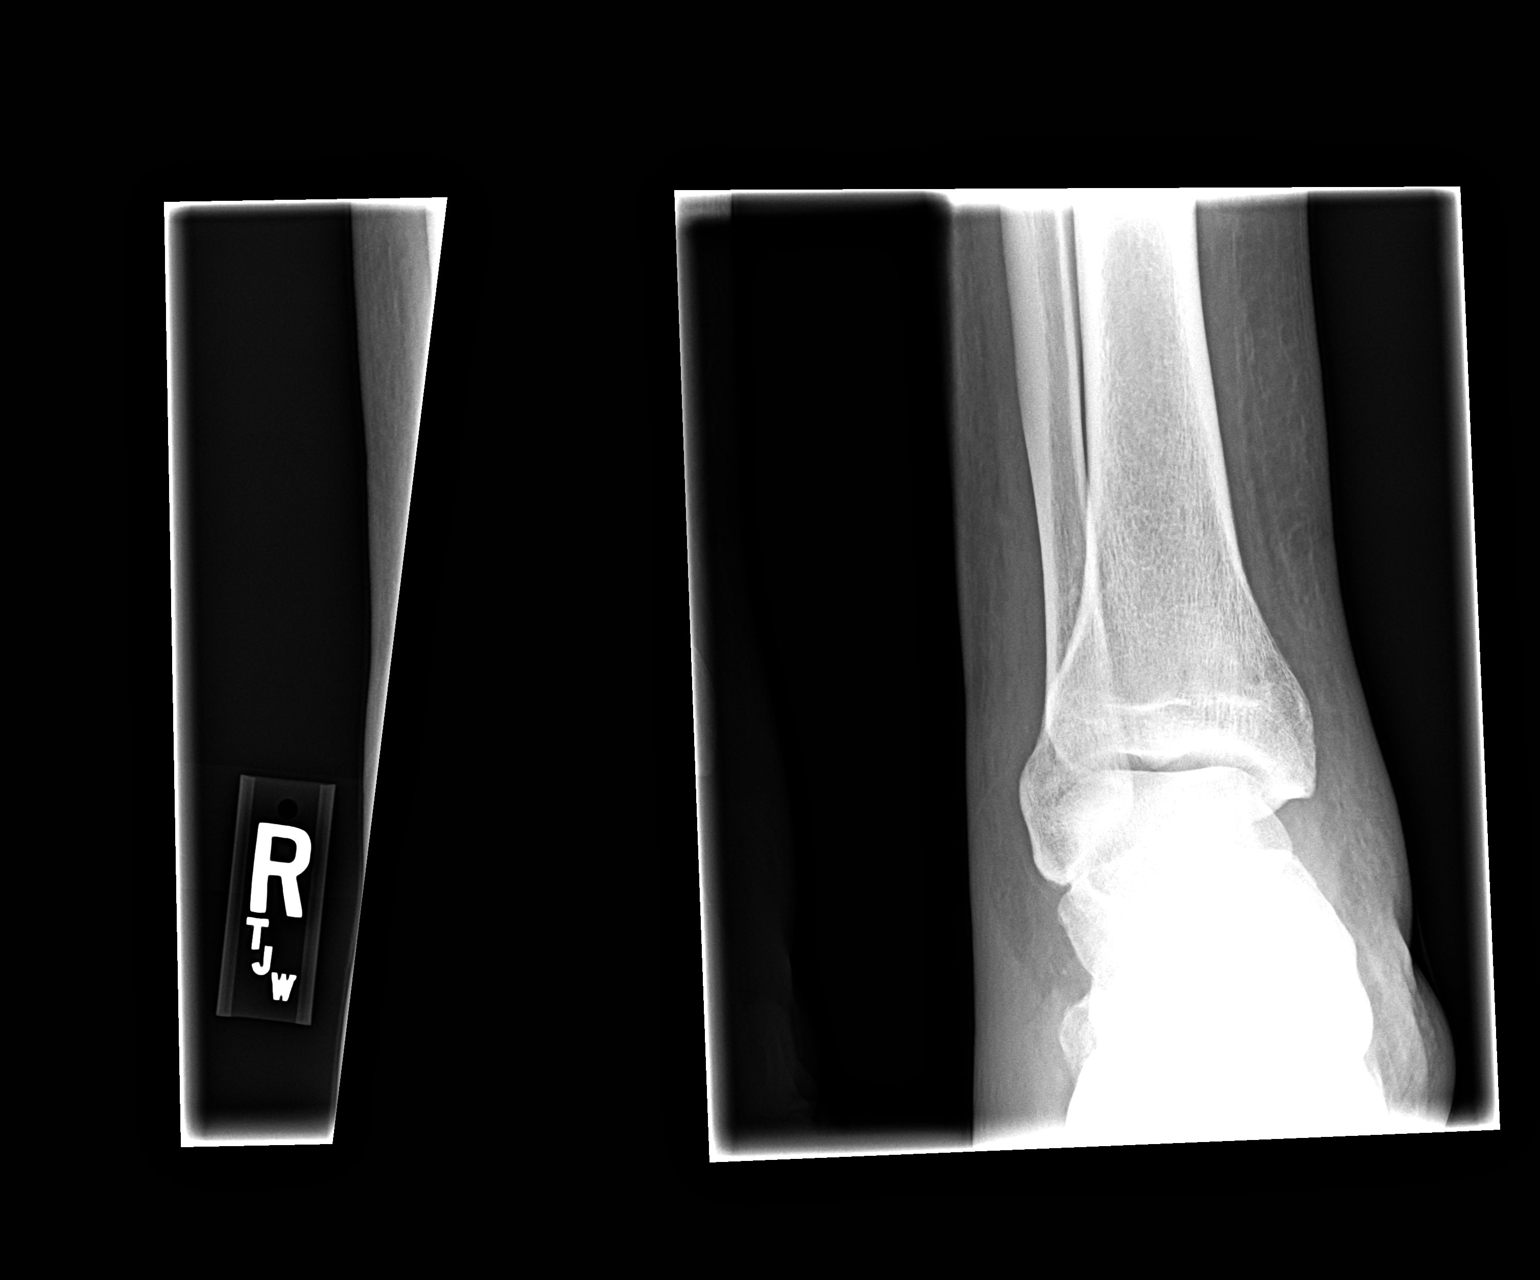

[view not recorded (3 of 3)]
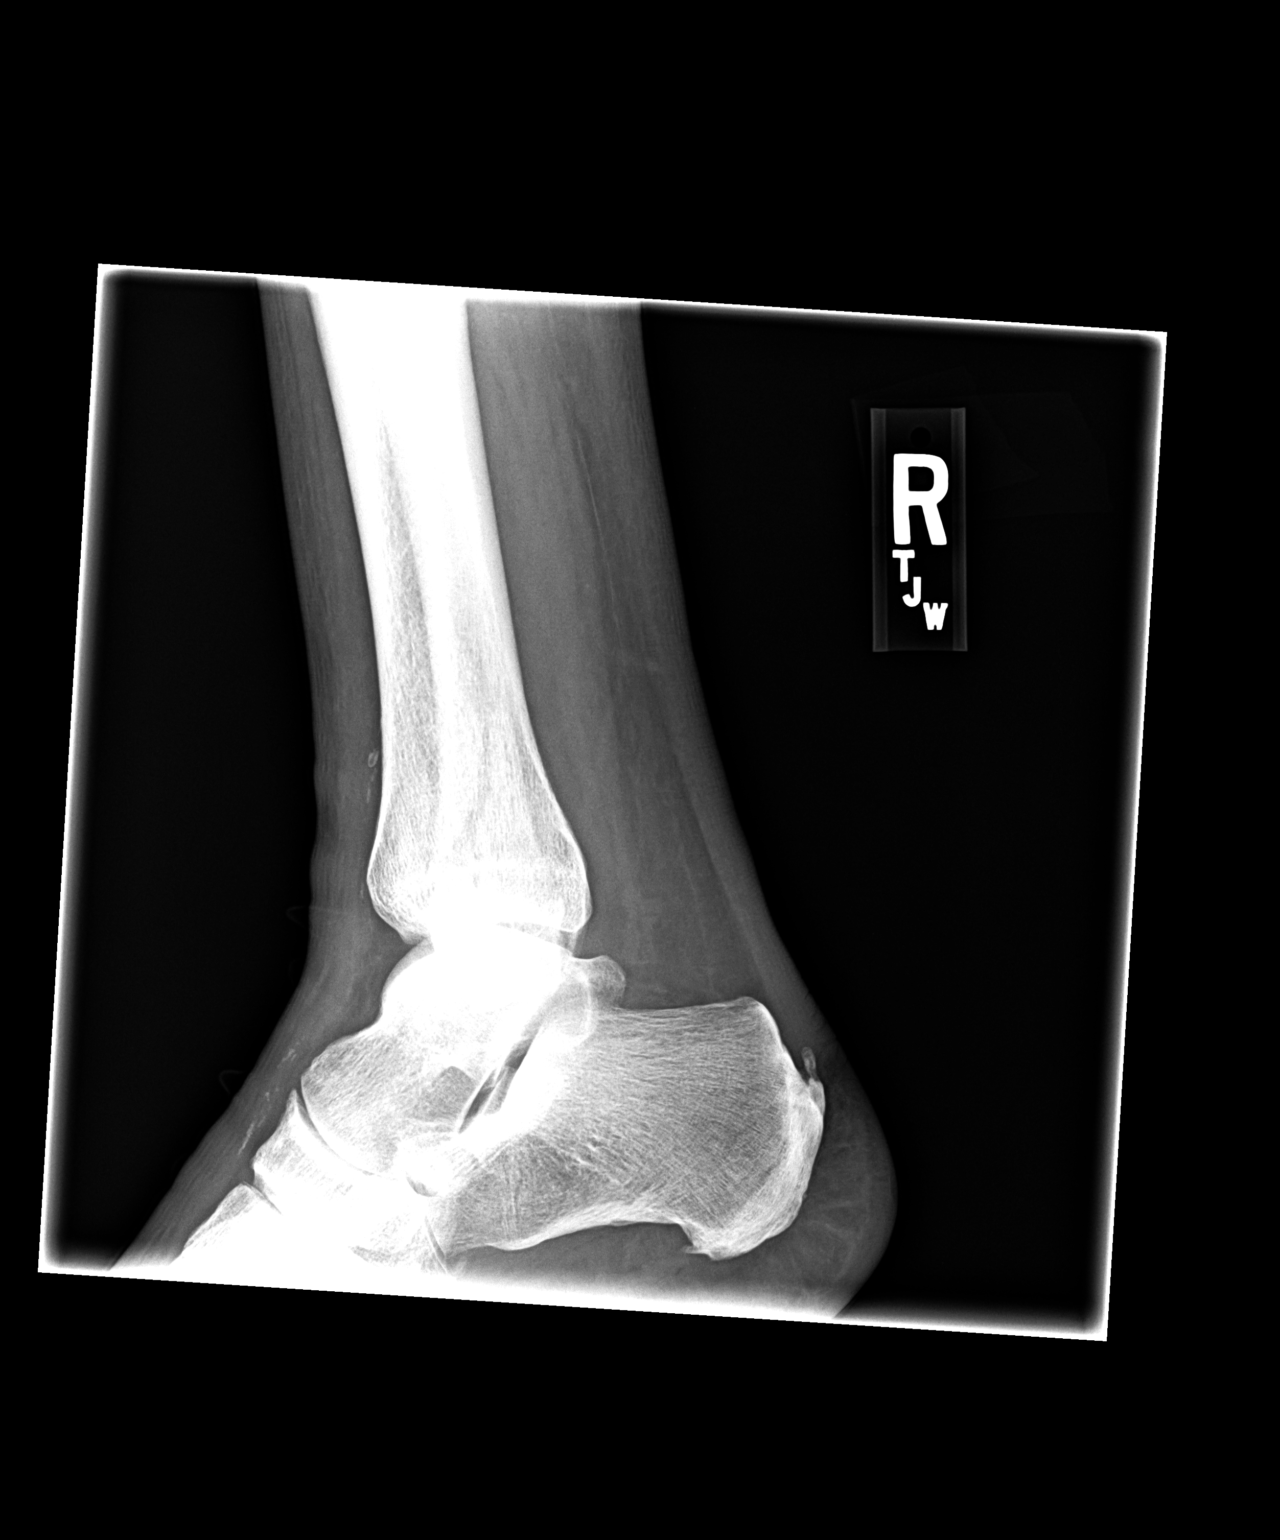

[3 of 3 positions shown; findings below may reference images not displayed]

FINDINGS: There is diffuse soft tissue swelling of the ankle.  No
evidence for acute fracture or dislocation.  Small plantar are and
Achilles calcaneal spurs are present.  Vascular calcifications are
noted.
IMPRESSION: 1.  Diffuse soft tissue swelling.
2. No evidence for acute bony abnormality.

## 2011-09-08 IMAGING — CR DG LUMBAR SPINE COMPLETE W/ BEND
7 series · 7 of 7 positions shown · non-contrast
Comparison: None

CLINICAL DATA: Lower back pain and coccyx pain.  The patient fell 2
weeks ago.  No priors.

LUMBAR SPINE - COMPLETE WITH BENDING VIEWS

[view not recorded (1 of 7)]
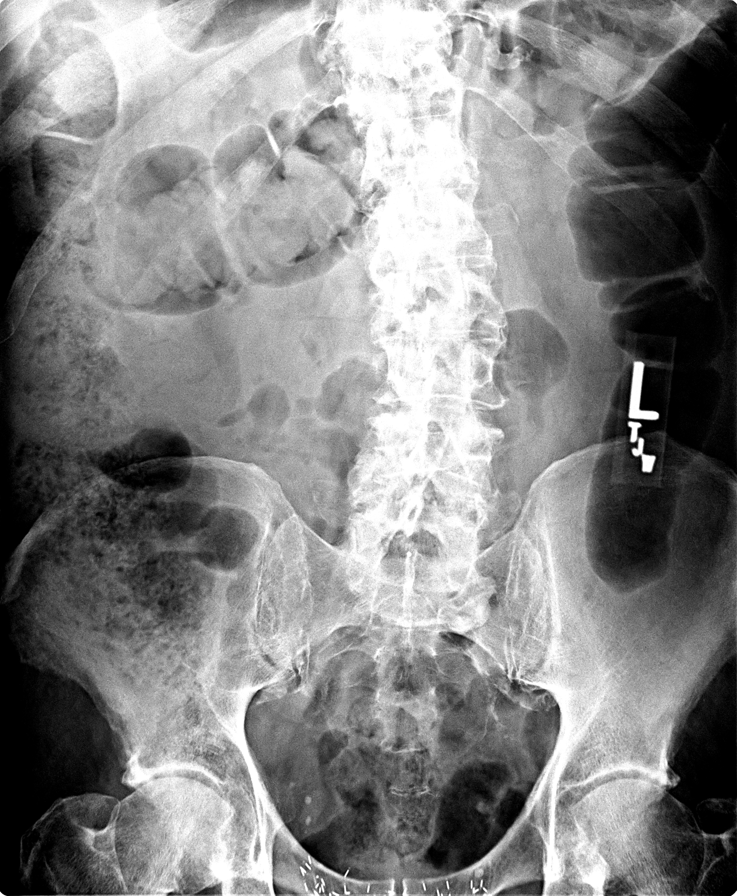

[view not recorded (2 of 7)]
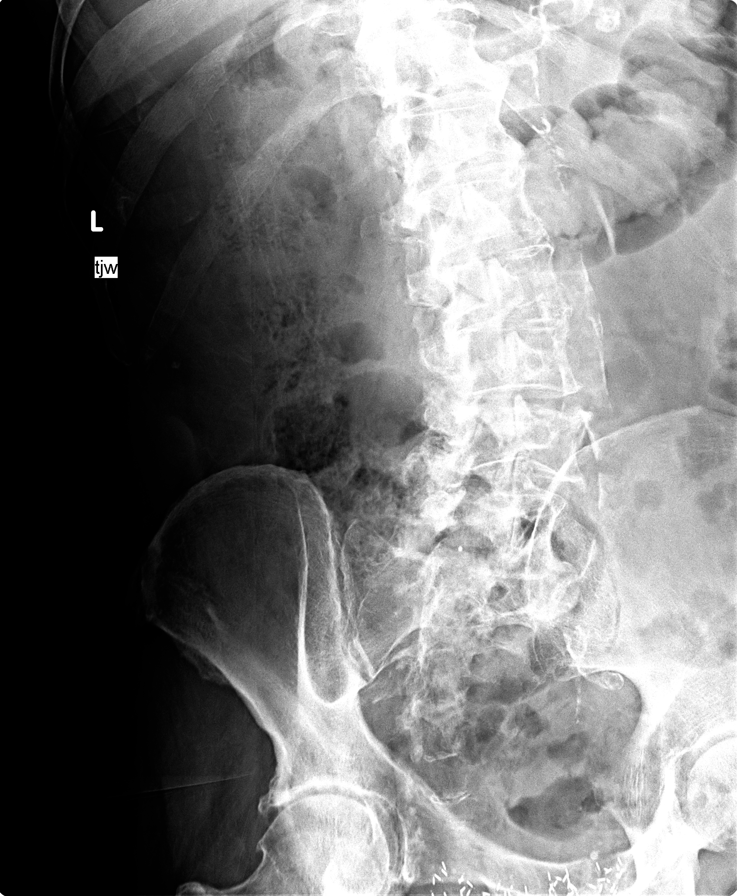

[view not recorded (3 of 7)]
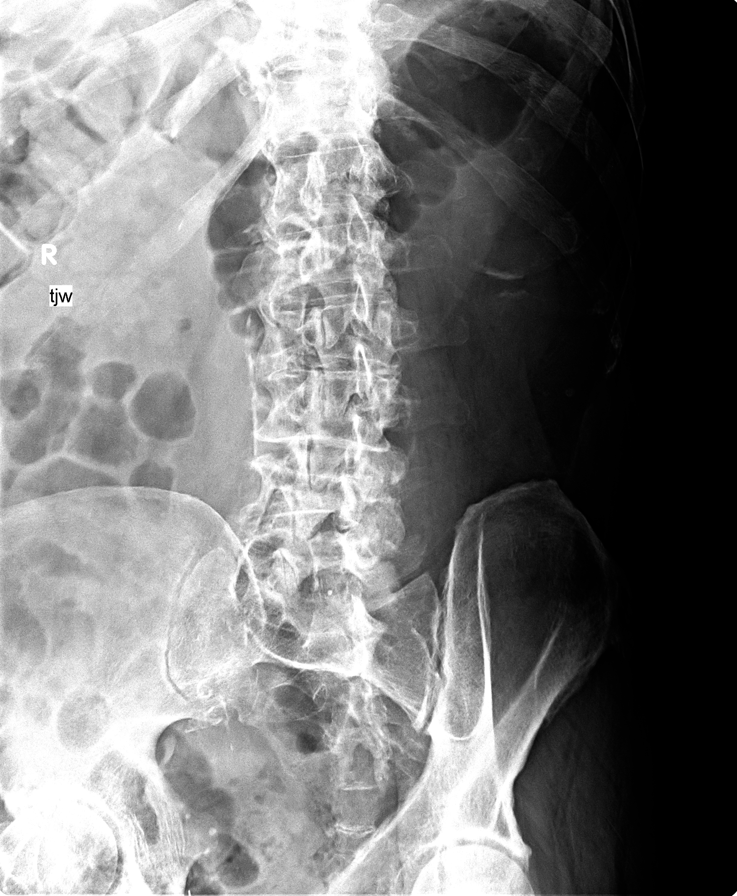

[view not recorded (4 of 7)]
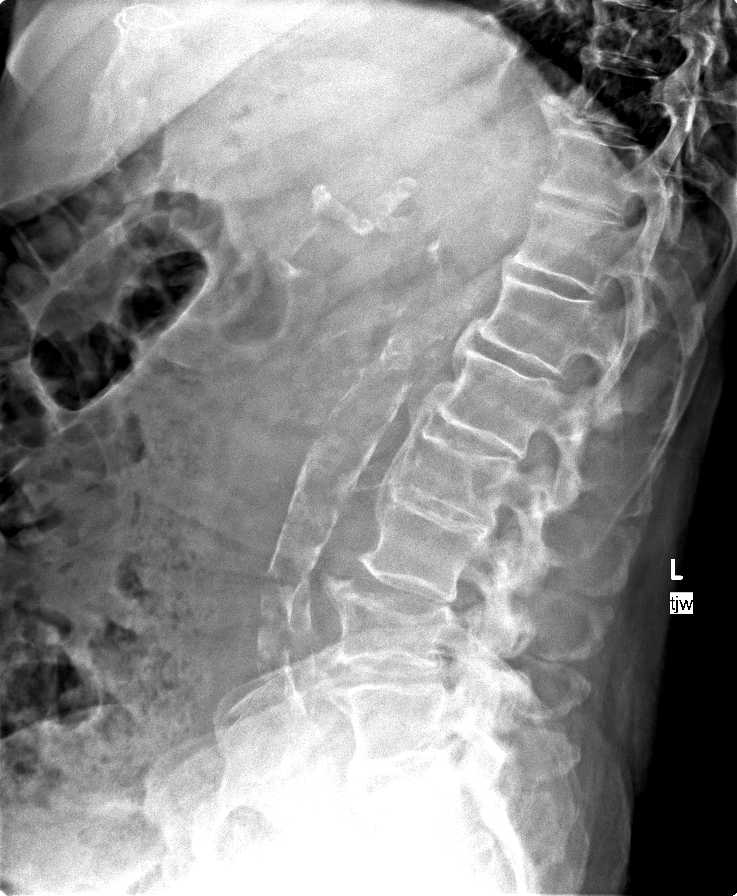

[view not recorded (5 of 7)]
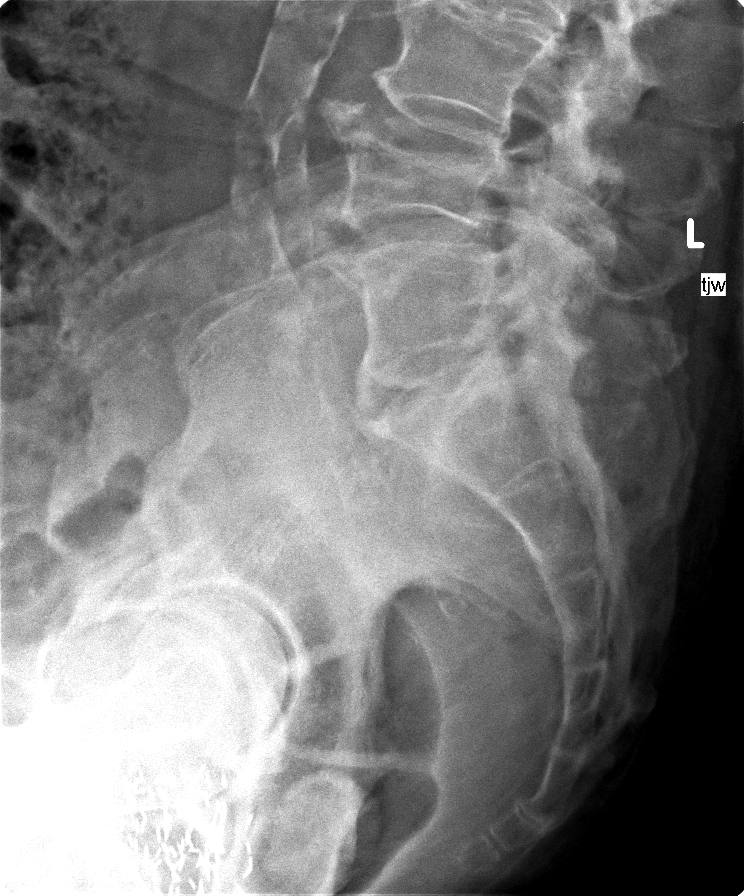

[view not recorded (6 of 7)]
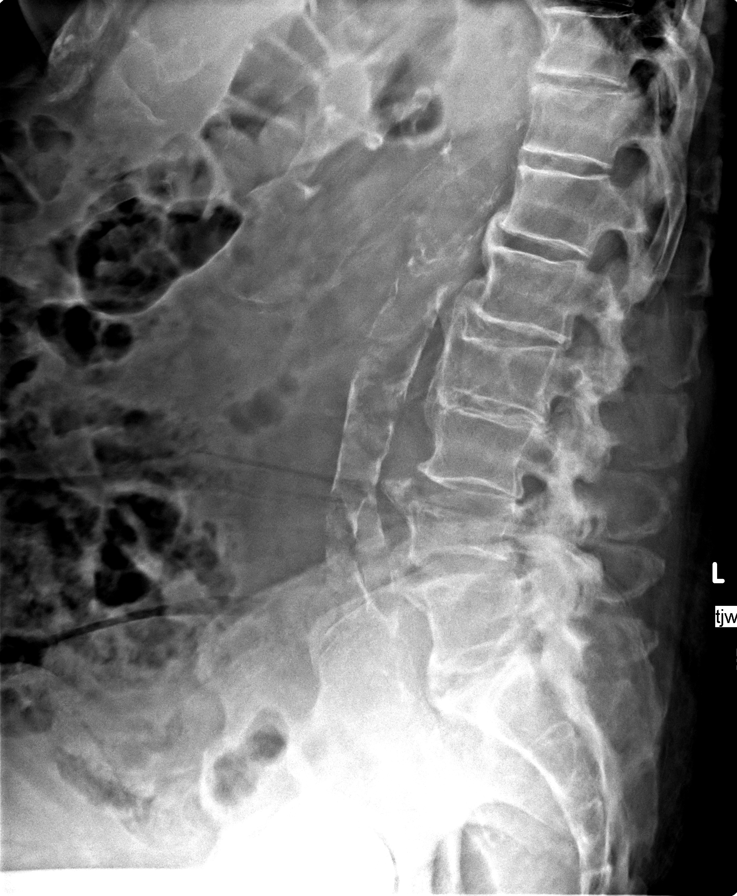

[view not recorded (7 of 7)]
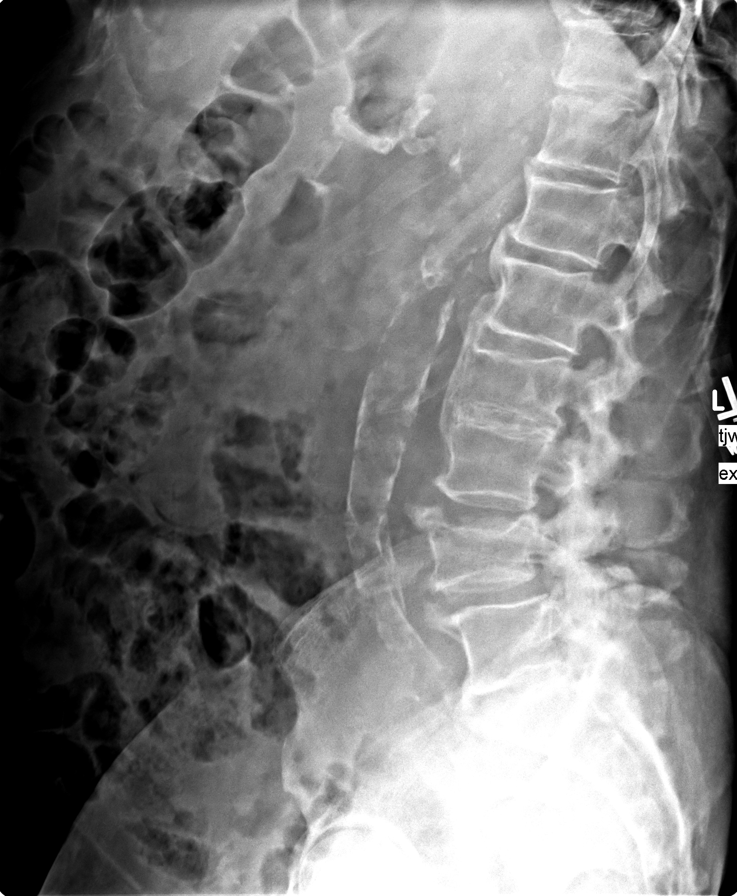

[7 of 7 positions shown; findings below may reference images not displayed]

FINDINGS: There are diffuse degenerative changes throughout the
lumbar spine.  There is a wedge compression deformity of L4,
favored to be chronic based on the morphology.  Large anterior
osteophyte is identified along the superior aspect of L4.  No
definite evidence for acute fracture dislocation.  No evidence for
spondylolisthesis.  Multiple prostatic seeds are present following
presumed treatment of prostatic cancer.  There is significant
atherosclerotic calcification of the abdominal aorta.  Moderate
stool seen throughout the ascending and transverse colon.
IMPRESSION: Significant degenerative change throughout the lumbar spine.  Wedge
fracture of L4 favored to be chronic.

## 2011-10-20 ENCOUNTER — Other Ambulatory Visit: Payer: Self-pay | Admitting: Cardiology

## 2011-11-21 ENCOUNTER — Ambulatory Visit: Payer: Medicare Other | Admitting: Cardiology

## 2011-12-19 ENCOUNTER — Encounter: Payer: Self-pay | Admitting: Cardiology

## 2011-12-19 ENCOUNTER — Ambulatory Visit (INDEPENDENT_AMBULATORY_CARE_PROVIDER_SITE_OTHER): Payer: Medicare Other | Admitting: Cardiology

## 2011-12-19 VITALS — BP 140/78 | HR 66 | Ht 68.0 in | Wt 200.1 lb

## 2011-12-19 DIAGNOSIS — I359 Nonrheumatic aortic valve disorder, unspecified: Secondary | ICD-10-CM

## 2011-12-19 DIAGNOSIS — I259 Chronic ischemic heart disease, unspecified: Secondary | ICD-10-CM

## 2011-12-19 DIAGNOSIS — I25810 Atherosclerosis of coronary artery bypass graft(s) without angina pectoris: Secondary | ICD-10-CM

## 2011-12-19 DIAGNOSIS — I10 Essential (primary) hypertension: Secondary | ICD-10-CM

## 2011-12-19 LAB — BASIC METABOLIC PANEL
BUN: 17 mg/dL (ref 6–23)
Chloride: 99 mEq/L (ref 96–112)
Potassium: 4 mEq/L (ref 3.5–5.3)
Sodium: 133 mEq/L — ABNORMAL LOW (ref 135–145)

## 2011-12-19 MED ORDER — FUROSEMIDE 20 MG PO TABS: ORAL_TABLET | ORAL | Status: DC

## 2011-12-19 NOTE — Progress Notes (Signed)
HPI:  Patient is stable.  No chest pain.  He had problems he thinks with generic plavix.  He developed decrease urine output, constipation, fever;  His son in law  (Dr. Elliot Gault) called and we switched him back.  He promptly improved.  [facility] about possible mechanisms.  No angina or congestive symptoms.    Current Outpatient Prescriptions  Medication Sig Dispense Refill  . amLODipine (NORVASC) 5 MG tablet Take 0.5 tablets (2.5 mg total) by mouth daily.  90 tablet  3  . aspirin 81 MG tablet Take 81 mg by mouth daily.        Marland Kitchen atenolol (TENORMIN) 25 MG tablet Take 12.5 mg by mouth daily.      . cholecalciferol (VITAMIN D) 1000 UNITS tablet Take 1,000 Units by mouth daily.        . cyanocobalamin 500 MCG tablet Take 500 mcg by mouth daily.        . furosemide (LASIX) 20 MG tablet Take 1 1/2 tablet daily       . isosorbide mononitrate (IMDUR) 120 MG 24 hr tablet TAKE 1 TABLET EVERY DAY  30 tablet  6  . nitroGLYCERIN (NITROSTAT) 0.4 MG SL tablet Place 0.4 mg under the tongue every 5 (five) minutes as needed.        . Omega-3 Fatty Acids (FISH OIL) 1000 MG CAPS Take 1 capsule by mouth daily.        Marland Kitchen PLAVIX 75 MG tablet Take 1 tablet (75 mg total) by mouth daily.  90 tablet  3  . VASOTEC 10 MG tablet Take 1 tablet (10 mg total) by mouth daily.  90 tablet  3  . DISCONTD: atenolol (TENORMIN) 25 MG tablet TAKE 1 TABLET EVERY DAY  90 tablet  6    Allergies  Allergen Reactions  . Penicillins     REACTION: rash  . Pravastatin Sodium     REACTION: tongue swelled  . Simvastatin     Past Medical History  Diagnosis Date  . Coronary artery disease   . Hypercholesterolemia   . Ischemic heart disease   . Hypertension   . Aortic stenosis   . Coronary atherosclerosis of native coronary artery   . Vertigo   . SOB (shortness of breath)   . Swelling of limb     Past Surgical History  Procedure Date  . Coronary artery bypass graft 1985    extensive 3 vessel disease    . Radium seed implant 1998  . Hemicolectomy     Family History  Problem Relation Age of Onset  . Other      Remarkable for heart disease, HTN and high blood pressure    History   Social History  . Marital Status: Married    Spouse Name: N/A    Number of Children: N/A  . Years of Education: N/A   Occupational History  . retired     since 1987   Social History Main Topics  . Smoking status: Never Smoker   . Smokeless tobacco: Not on file  . Alcohol Use: No  . Drug Use: No  . Sexually Active: Not on file   Other Topics Concern  . Not on file   Social History Narrative  . No narrative on file    ROS: Please see the HPI.  All other systems reviewed and negative.  PHYSICAL EXAM:  BP 140/78  Pulse 66  Ht 5\' 8"  (1.727 m)  Wt 200  lb 1.9 oz (90.774 kg)  BMI 30.43 kg/m2  General: Well developed, well nourished, in no acute distress. Head:  Normocephalic and atraumatic. Neck: no JVD Lungs: Clear to auscultation and percussion. Heart: Normal S1 and S2.  3-4/6 SEM.  No DM.   Abdomen:  Normal bowel sounds; soft; non tender; no organomegaly Pulses: Pulses normal in all 4 extremities. Extremities: No clubbing or cyanosis. Trace edema.   Neurologic: Alert and oriented x 3.  EKG:  NSR.  LVH with mild T abnormality.  Probable anterior MI.    ASSESSMENT AND PLAN:

## 2011-12-19 NOTE — Assessment & Plan Note (Signed)
Well controlled 

## 2011-12-19 NOTE — Patient Instructions (Signed)
Your physician recommends that you schedule a follow-up appointment in: 2 MONTHS with Dr Riley Kill  Your physician has requested that you have an echocardiogram in 2 MONTHS. Echocardiography is a painless test that uses sound waves to create images of your heart. It provides your doctor with information about the size and shape of your heart and how well your heart's chambers and valves are working. This procedure takes approximately one hour. There are no restrictions for this procedure.  Your physician recommends that you have lab work today: [facility]  Your physician recommends that you continue on your current medications as directed. Please refer to the Current Medication list given to you today.

## 2011-12-19 NOTE — Assessment & Plan Note (Signed)
No current angina 

## 2011-12-19 NOTE — Assessment & Plan Note (Signed)
Probably some worsening.  Would favor repeat echo in the next couple of months, and reassess status.  No unstable symptoms.  He  is not a candidate for AVR but might be for valvuloplasty.

## 2012-02-13 ENCOUNTER — Other Ambulatory Visit: Payer: Self-pay | Admitting: Cardiology

## 2012-03-19 ENCOUNTER — Other Ambulatory Visit: Payer: Self-pay

## 2012-03-19 ENCOUNTER — Ambulatory Visit (INDEPENDENT_AMBULATORY_CARE_PROVIDER_SITE_OTHER): Payer: Medicare Other | Admitting: Cardiology

## 2012-03-19 ENCOUNTER — Encounter: Payer: Self-pay | Admitting: Cardiology

## 2012-03-19 ENCOUNTER — Ambulatory Visit (HOSPITAL_COMMUNITY): Payer: Medicare Other | Attending: Cardiovascular Disease | Admitting: Radiology

## 2012-03-19 VITALS — BP 112/52 | HR 58 | Resp 18 | Ht 68.0 in | Wt 199.8 lb

## 2012-03-19 DIAGNOSIS — I10 Essential (primary) hypertension: Secondary | ICD-10-CM | POA: Insufficient documentation

## 2012-03-19 DIAGNOSIS — I25810 Atherosclerosis of coronary artery bypass graft(s) without angina pectoris: Secondary | ICD-10-CM

## 2012-03-19 DIAGNOSIS — I259 Chronic ischemic heart disease, unspecified: Secondary | ICD-10-CM

## 2012-03-19 DIAGNOSIS — I359 Nonrheumatic aortic valve disorder, unspecified: Secondary | ICD-10-CM | POA: Insufficient documentation

## 2012-03-19 DIAGNOSIS — E669 Obesity, unspecified: Secondary | ICD-10-CM | POA: Insufficient documentation

## 2012-03-19 DIAGNOSIS — I517 Cardiomegaly: Secondary | ICD-10-CM | POA: Insufficient documentation

## 2012-03-19 DIAGNOSIS — E7800 Pure hypercholesterolemia, unspecified: Secondary | ICD-10-CM

## 2012-03-19 DIAGNOSIS — R011 Cardiac murmur, unspecified: Secondary | ICD-10-CM | POA: Insufficient documentation

## 2012-03-19 DIAGNOSIS — I2510 Atherosclerotic heart disease of native coronary artery without angina pectoris: Secondary | ICD-10-CM | POA: Insufficient documentation

## 2012-03-19 DIAGNOSIS — I2589 Other forms of chronic ischemic heart disease: Secondary | ICD-10-CM | POA: Insufficient documentation

## 2012-03-19 LAB — CBC WITH DIFFERENTIAL/PLATELET
Basophils Absolute: 0 10*3/uL (ref 0.0–0.1)
Basophils Relative: 0.4 % (ref 0.0–3.0)
Eosinophils Absolute: 0.3 10*3/uL (ref 0.0–0.7)
Hemoglobin: 15.8 g/dL (ref 13.0–17.0)
Lymphocytes Relative: 24.6 % (ref 12.0–46.0)
MCHC: 33.6 g/dL (ref 30.0–36.0)
MCV: 90.7 fl (ref 78.0–100.0)
Monocytes Absolute: 0.8 10*3/uL (ref 0.1–1.0)
Neutro Abs: 5.3 10*3/uL (ref 1.4–7.7)
RBC: 5.17 Mil/uL (ref 4.22–5.81)
RDW: 13.6 % (ref 11.5–14.6)

## 2012-03-19 LAB — BASIC METABOLIC PANEL
CO2: 28 mEq/L (ref 19–32)
Calcium: 9.2 mg/dL (ref 8.4–10.5)
Chloride: 96 mEq/L (ref 96–112)
Creatinine, Ser: 1.2 mg/dL (ref 0.4–1.5)
Glucose, Bld: 105 mg/dL — ABNORMAL HIGH (ref 70–99)
Sodium: 132 mEq/L — ABNORMAL LOW (ref 135–145)

## 2012-03-19 NOTE — Progress Notes (Signed)
Echocardiogram performed.  

## 2012-03-19 NOTE — Patient Instructions (Signed)
Your physician recommends that you schedule a follow-up appointment in: 3 MONTHS with Dr Stuckey  Your physician recommends that you have lab work today: BMP and CBC  Your physician recommends that you continue on your current medications as directed. Please refer to the Current Medication list given to you today.   

## 2012-03-22 ENCOUNTER — Telehealth: Payer: Self-pay | Admitting: Cardiology

## 2012-03-22 MED ORDER — NITROGLYCERIN 0.4 MG SL SUBL: 0.4000 mg | SUBLINGUAL_TABLET | SUBLINGUAL | Status: DC | PRN

## 2012-03-22 NOTE — Telephone Encounter (Signed)
Pt out of town needs faxed  rx for nitro, out of town and forgot to bring it, only need aty 3 if possible [facility] [phone]/ phone [phone]

## 2012-03-28 NOTE — Progress Notes (Signed)
HPI:  The patient is in for follow up.  He is excited in that he is getting ready to be taken to [facility] for the week.  He has not had any new major issues.  He actually has not had any angina per se.  He is accompanied by his daughter, and her husband, a gynecologist.  They made the observation of problems with generic plavix, and the patient improved after returning to name brand.  We speculated, with the pharmacy, that this might have been related to the dyes used in the generic pill prep.  We were not sure, but his son in law is fairly convinced about some issue based on his careful observation.  The patient has been fine ever since.  His echo was done today, and we reviewed the findings in the clinic.    Current Outpatient Prescriptions  Medication Sig Dispense Refill  . amLODipine (NORVASC) 5 MG tablet Take 0.5 tablets (2.5 mg total) by mouth daily.  90 tablet  3  . aspirin 81 MG tablet Take 81 mg by mouth daily.        Marland Kitchen atenolol (TENORMIN) 25 MG tablet Take 12.5 mg by mouth daily.      . cholecalciferol (VITAMIN D) 1000 UNITS tablet Take 1,000 Units by mouth daily.        . cyanocobalamin 500 MCG tablet Take 500 mcg by mouth daily.        . furosemide (LASIX) 20 MG tablet TAKE 1 1/2 TABLETS DAILY  135 tablet  3  . isosorbide mononitrate (IMDUR) 120 MG 24 hr tablet TAKE 1 TABLET EVERY DAY  30 tablet  1  . Omega-3 Fatty Acids (FISH OIL) 1000 MG CAPS Take 1 capsule by mouth daily.        Marland Kitchen PLAVIX 75 MG tablet Take 1 tablet (75 mg total) by mouth daily.  90 tablet  3  . VASOTEC 10 MG tablet Take 1 tablet (10 mg total) by mouth daily.  90 tablet  3  . nitroGLYCERIN (NITROSTAT) 0.4 MG SL tablet Place 1 tablet (0.4 mg total) under the tongue every 5 (five) minutes as needed.  25 tablet  12    Allergies  Allergen Reactions  . Penicillins     REACTION: rash  . Pravastatin Sodium     REACTION: tongue swelled  . Simvastatin     Past Medical History  Diagnosis Date  . Coronary artery  disease   . Hypercholesterolemia   . Ischemic heart disease   . Hypertension   . Aortic stenosis   . Coronary atherosclerosis of native coronary artery   . Vertigo   . SOB (shortness of breath)   . Swelling of limb     Past Surgical History  Procedure Date  . Coronary artery bypass graft 1985    extensive 3 vessel disease  . Radium seed implant 1998  . Hemicolectomy     Family History  Problem Relation Age of Onset  . Other      Remarkable for heart disease, HTN and high blood pressure    History   Social History  . Marital Status: Married    Spouse Name: N/A    Number of Children: N/A  . Years of Education: N/A   Occupational History  . retired     since 1987   Social History Main Topics  . Smoking status: Never Smoker   . Smokeless tobacco: Not on file  . Alcohol Use: No  . Drug  Use: No  . Sexually Active: Not on file   Other Topics Concern  . Not on file   Social History Narrative  . No narrative on file    ROS: Please see the HPI.  All other systems reviewed and negative.  PHYSICAL EXAM:  BP 112/52  Pulse 58  Resp 18  Ht 5\' 8"  (1.727 m)  Wt 199 lb 12.8 oz (90.629 kg)  BMI 30.38 kg/m2  SpO2 93%  General: Well developed, well nourished, in no acute distress.  Sitting in wheel chair.   Head:  Normocephalic and atraumatic. Neck: no JVD Lungs: Clear to auscultation and percussion. Heart: Normal S1 and S2.3/6 SEM mostly at LSE, no DM.   Abdomen:  Normal bowel sounds; soft; non tender; no organomegaly Pulses: Pulses normal in all 4 extremities. Extremities: No clubbing or cyanosis. Trace edema.  Neurologic: Alert and oriented x 3.  EKG:  SB.  Anterior MI, age indeterminate.  HR 50.  From last tracing, rate slower  (pt stable)  ECHO  03/19/2012  Study Conclusions  - Left ventricle: Septal and apical hypokinesis The cavity size was mildly dilated. Wall thickness was increased in a pattern of mild LVH. The estimated ejection fraction  was 45%. - Aortic valve: There was moderate stenosis. Mild regurgitation. Mean gradient: 33mm Hg (S). Peak gradient: 64mm Hg (S). - Mitral valve: Nodular calcification of anterior leaflet Calcified annulus. - Left atrium: The atrium was moderately dilated. - Atrial septum: No defect or patent foramen ovale was identified.     ASSESSMENT AND PLAN:

## 2012-04-04 NOTE — Assessment & Plan Note (Signed)
He is not progressively symptomatic at this point, but is inactive so it would be hard to tell.  He is not a surgical candidate, really, and in fact I doubt TAVR would be an option with his severe CAD.  We will continue to monitor this.  He might be a candidate for palliative balloon valvuloplasty if in fact he became worse.

## 2012-04-04 NOTE — Assessment & Plan Note (Signed)
No current symptoms, but inactive.  Continue medical therapy.

## 2012-04-04 NOTE — Assessment & Plan Note (Signed)
Not presently on a statin.

## 2012-04-13 NOTE — Addendum Note (Signed)
Addended by: Lacie Scotts on: 04/13/2012 05:46 PM   Modules accepted: Orders

## 2012-04-27 ENCOUNTER — Other Ambulatory Visit: Payer: Self-pay | Admitting: Cardiology

## 2012-05-18 ENCOUNTER — Other Ambulatory Visit: Payer: Self-pay | Admitting: Cardiology

## 2012-05-21 ENCOUNTER — Ambulatory Visit (INDEPENDENT_AMBULATORY_CARE_PROVIDER_SITE_OTHER): Payer: Medicare Other

## 2012-05-21 DIAGNOSIS — Z23 Encounter for immunization: Secondary | ICD-10-CM

## 2012-06-22 ENCOUNTER — Ambulatory Visit: Payer: Medicare Other | Admitting: Cardiology

## 2012-06-28 ENCOUNTER — Other Ambulatory Visit: Payer: Self-pay | Admitting: Cardiology

## 2012-07-01 ENCOUNTER — Other Ambulatory Visit: Payer: Self-pay | Admitting: Cardiology

## 2012-08-06 ENCOUNTER — Ambulatory Visit (INDEPENDENT_AMBULATORY_CARE_PROVIDER_SITE_OTHER): Payer: Medicare Other | Admitting: Cardiology

## 2012-08-06 ENCOUNTER — Encounter: Payer: Self-pay | Admitting: Cardiology

## 2012-08-06 VITALS — BP 138/72 | HR 58 | Ht 67.0 in | Wt 188.4 lb

## 2012-08-06 DIAGNOSIS — I359 Nonrheumatic aortic valve disorder, unspecified: Secondary | ICD-10-CM

## 2012-08-06 DIAGNOSIS — I10 Essential (primary) hypertension: Secondary | ICD-10-CM

## 2012-08-06 DIAGNOSIS — E7800 Pure hypercholesterolemia, unspecified: Secondary | ICD-10-CM

## 2012-08-06 DIAGNOSIS — I25810 Atherosclerosis of coronary artery bypass graft(s) without angina pectoris: Secondary | ICD-10-CM

## 2012-08-06 LAB — BASIC METABOLIC PANEL
BUN: 20 mg/dL (ref 6–23)
Calcium: 9.5 mg/dL (ref 8.4–10.5)
Chloride: 97 mEq/L (ref 96–112)
Creat: 1.16 mg/dL (ref 0.50–1.35)

## 2012-08-06 MED ORDER — PLAVIX 75 MG PO TABS: 75.0000 mg | ORAL_TABLET | Freq: Every day | ORAL | Status: DC

## 2012-08-06 NOTE — Patient Instructions (Addendum)
Your physician recommends that you have lab work today: [facility]  Your physician recommends that you schedule a follow-up appointment in: MARCH 2014  Your physician recommends that you continue on your current medications as directed. Please refer to the Current Medication list given to you today.

## 2012-08-07 NOTE — Progress Notes (Signed)
HPI:   The patient returns in a followup visit. He really is doing quite well. He  did have a weak spell on Friday of last week, and the patient believes it is related to eating too much over Thanksgiving. However, it is unclear as to what happened. They had a hard time getting him up out of a chair, but he was not unconscious at all. He was just weak. Nobody at that time did take a blood pressure or pulse. He did not have chest pain, shortness of breath or diaphoresis associated with this.  Current Outpatient Prescriptions  Medication Sig Dispense Refill  . amLODipine (NORVASC) 5 MG tablet ONE-HALF TABLET DAILY  45 tablet  3  . amLODipine (NORVASC) 5 MG tablet ONE-HALF TABLET DAILY  45 tablet  3  . aspirin 81 MG tablet Take 81 mg by mouth daily.        Marland Kitchen atenolol (TENORMIN) 25 MG tablet Take 12.5 mg by mouth daily.      . cholecalciferol (VITAMIN D) 1000 UNITS tablet Take 1,000 Units by mouth daily.        . cyanocobalamin 500 MCG tablet Take 500 mcg by mouth daily.        . furosemide (LASIX) 20 MG tablet TAKE 1 1/2 TABLETS DAILY  135 tablet  3  . isosorbide mononitrate (IMDUR) 120 MG 24 hr tablet TAKE 1 TABLET EVERY DAY  90 tablet  3  . nitroGLYCERIN (NITROSTAT) 0.4 MG SL tablet Place 1 tablet (0.4 mg total) under the tongue every 5 (five) minutes as needed.  25 tablet  12  . Omega-3 Fatty Acids (FISH OIL) 1000 MG CAPS Take 1 capsule by mouth daily.        Marland Kitchen PLAVIX 75 MG tablet Take 1 tablet (75 mg total) by mouth daily.  90 tablet  3  . VASOTEC 10 MG tablet TAKE 1 TABLET (10 MG TOTAL) BY MOUTH DAILY.  90 tablet  3    Allergies  Allergen Reactions  . Penicillins     REACTION: rash  . Pravastatin Sodium     REACTION: tongue swelled  . Simvastatin     Past Medical History  Diagnosis Date  . Coronary artery disease   . Hypercholesterolemia   . Ischemic heart disease   . Hypertension   . Aortic stenosis   . Coronary atherosclerosis of native coronary artery   . Vertigo   . SOB  (shortness of breath)   . Swelling of limb     Past Surgical History  Procedure Date  . Coronary artery bypass graft 1985    extensive 3 vessel disease  . Radium seed implant 1998  . Hemicolectomy     Family History  Problem Relation Age of Onset  . Other      Remarkable for heart disease, HTN and high blood pressure    History   Social History  . Marital Status: Married    Spouse Name: N/A    Number of Children: N/A  . Years of Education: N/A   Occupational History  . retired     since 1987   Social History Main Topics  . Smoking status: Never Smoker   . Smokeless tobacco: Not on file  . Alcohol Use: No  . Drug Use: No  . Sexually Active: Not on file   Other Topics Concern  . Not on file   Social History Narrative  . No narrative on file    ROS: Please see the  HPI.  All other systems reviewed and negative.  PHYSICAL EXAM:  BP 138/72  Pulse 58  Ht 5\' 7"  (1.702 m)  Wt 188 lb 6.4 oz (85.458 kg)  BMI 29.51 kg/m2  SpO2 97%  General: Elderly gentleman sitting in a wheel chair.   Head:  Normocephalic and atraumatic. Neck: no JVD Lungs: Clear to auscultation and percussion. Heart: Normal S1 and S2. 2/6 SEM.  No diastolic murmur.   Pulses: Pulses normal in all 4 extremities. Extremities: No clubbing or cyanosis. Mild edema, trace bilaterally.   Neurologic: Alert and oriented x 3.  EKG:  NSR.  Inferlateral T inversion, non specific.  Compared to last tracing, slightly more T inversion, but also similar to April 2013 tracing--in the absence of clinical change.    ASSESSMENT AND PLAN:

## 2012-08-22 NOTE — Assessment & Plan Note (Signed)
This certainly could have precipitated an event.  He is not at a critical stage.  His severe CAD might likely exclude him from a perc vavle.  He is sedentary.  We will continue to monitor his progress.

## 2012-08-22 NOTE — Assessment & Plan Note (Signed)
Presently controlled.

## 2012-08-22 NOTE — Assessment & Plan Note (Signed)
-   Not on a statin. -Patient transitioned to comfort measures. 

## 2012-08-22 NOTE — Assessment & Plan Note (Signed)
He has continued to remain fragile, but yet he has remained stable despite all of this.  Not clear what precipitated his "weak" spell.  Would not necessarily change course at this point in time.

## 2012-09-28 ENCOUNTER — Other Ambulatory Visit: Payer: Self-pay

## 2012-09-28 MED ORDER — ATENOLOL 25 MG PO TABS: 12.5000 mg | ORAL_TABLET | Freq: Every day | ORAL | Status: DC

## 2012-11-19 ENCOUNTER — Encounter: Payer: Self-pay | Admitting: Cardiology

## 2012-11-19 ENCOUNTER — Ambulatory Visit (INDEPENDENT_AMBULATORY_CARE_PROVIDER_SITE_OTHER): Payer: Medicare Other | Admitting: Cardiology

## 2012-11-19 VITALS — BP 128/60 | HR 58 | Ht 68.0 in | Wt 196.0 lb

## 2012-11-19 DIAGNOSIS — I10 Essential (primary) hypertension: Secondary | ICD-10-CM

## 2012-11-19 DIAGNOSIS — E7800 Pure hypercholesterolemia, unspecified: Secondary | ICD-10-CM

## 2012-11-19 DIAGNOSIS — I359 Nonrheumatic aortic valve disorder, unspecified: Secondary | ICD-10-CM

## 2012-11-19 DIAGNOSIS — I25810 Atherosclerosis of coronary artery bypass graft(s) without angina pectoris: Secondary | ICD-10-CM

## 2012-11-19 NOTE — Assessment & Plan Note (Addendum)
Had moderate angina in the past but not so much in the recent past.  Continue medical therapy.  He continues to do well. Remains on DAPT and two drug treatment.

## 2012-11-19 NOTE — Progress Notes (Signed)
HPI:  Patient returns in followup today. He seems to be doing extremely well. His in his wheelchair, but is not having much in the way of symptoms. The patient has moderately severe aortic stenosis, and known severe graft disease. He's from [place], and would like to follow up with Dr. Mariah Milling, which I assured him was a great choice.  Denies active chest pain although  he is not that active.    Current Outpatient Prescriptions  Medication Sig Dispense Refill  . amLODipine (NORVASC) 5 MG tablet ONE-HALF TABLET DAILY  45 tablet  3  . aspirin 81 MG tablet Take 81 mg by mouth daily.        Marland Kitchen atenolol (TENORMIN) 25 MG tablet Take 0.5 tablets (12.5 mg total) by mouth daily.  15 tablet  5  . cholecalciferol (VITAMIN D) 1000 UNITS tablet Take 1,000 Units by mouth daily.        . cyanocobalamin 500 MCG tablet Take 500 mcg by mouth daily.        . furosemide (LASIX) 20 MG tablet TAKE 1 1/2 TABLETS DAILY  135 tablet  3  . isosorbide mononitrate (IMDUR) 120 MG 24 hr tablet TAKE 1 TABLET EVERY DAY  90 tablet  3  . nitroGLYCERIN (NITROSTAT) 0.4 MG SL tablet Place 1 tablet (0.4 mg total) under the tongue every 5 (five) minutes as needed.  25 tablet  12  . Omega-3 Fatty Acids (FISH OIL) 1000 MG CAPS Take 1 capsule by mouth daily.        Marland Kitchen PLAVIX 75 MG tablet Take 1 tablet (75 mg total) by mouth daily.  90 tablet  3  . VASOTEC 10 MG tablet TAKE 1 TABLET (10 MG TOTAL) BY MOUTH DAILY.  90 tablet  3   No current facility-administered medications for this visit.    Allergies  Allergen Reactions  . Penicillins     REACTION: rash  . Pravastatin Sodium     REACTION: tongue swelled  . Simvastatin     Past Medical History  Diagnosis Date  . Coronary artery disease   . Hypercholesterolemia   . Ischemic heart disease   . Hypertension   . Aortic stenosis   . Coronary atherosclerosis of native coronary artery   . Vertigo   . SOB (shortness of breath)   . Swelling of limb     Past Surgical History    Procedure Laterality Date  . Coronary artery bypass graft  1985    extensive 3 vessel disease  . Radium seed implant  1998  . Hemicolectomy      Family History  Problem Relation Age of Onset  . Other      Remarkable for heart disease, HTN and high blood pressure    History   Social History  . Marital Status: Married    Spouse Name: N/A    Number of Children: N/A  . Years of Education: N/A   Occupational History  . retired     since 1987   Social History Main Topics  . Smoking status: Never Smoker   . Smokeless tobacco: Not on file  . Alcohol Use: No  . Drug Use: No  . Sexually Active: Not on file   Other Topics Concern  . Not on file   Social History Narrative  . No narrative on file    ROS: Please see the HPI.  All other systems reviewed and negative.  PHYSICAL EXAM:  BP 128/60  Pulse 58  Ht 5'  8" (1.727 m)  Wt 196 lb (88.905 kg)  BMI 29.81 kg/m2  SpO2 97%  General: Well developed, well nourished, in no acute distress.  Sitting in wheel chair.   Head:  Normocephalic and atraumatic. Neck: no JVD Lungs: Clear to auscultation and percussion. Heart: Normal S1 and S2.  SEM  3/6.  No DM.   Pulses: Pulses normal in all 4 extremities. Extremities: No clubbing or cyanosis. No edema. Neurologic: Alert and oriented x 3.  EKG:  NSR with marked sinus arrhythmia.  Inferior T changes, cannot exclude ischemia.    ECHOCARDIOGRAM  Study Conclusions  - Left ventricle: Septal and apical hypokinesis The cavity size was mildly dilated. Wall thickness was increased in a pattern of mild LVH. The estimated ejection fraction was 45%. - Aortic valve: There was moderate stenosis. Mild regurgitation. Mean gradient: 33mm Hg (S). Peak gradient: 64mm Hg (S). - Mitral valve: Nodular calcification of anterior leaflet Calcified annulus. - Left atrium: The atrium was moderately dilated. - Atrial septum: No defect or patent foramen ovale  was identified.     ASSESSMENT AND PLAN:  We will arrange fu with Dr. Mariah Milling.

## 2012-11-19 NOTE — Patient Instructions (Signed)
Your physician recommends that you schedule a follow-up appointment in: 3-4 MONTHS with Dr Mariah Milling (previous pt of Dr Riley Kill)  Your physician recommends that you continue on your current medications as directed. Please refer to the Current Medication list given to you today.

## 2012-11-19 NOTE — Assessment & Plan Note (Addendum)
Moderately severe but stable.  Not a good surgical candidate.

## 2012-11-23 NOTE — Assessment & Plan Note (Signed)
Well controlled 

## 2012-11-23 NOTE — Assessment & Plan Note (Signed)
Poor tolerance of statins

## 2013-02-04 ENCOUNTER — Ambulatory Visit (INDEPENDENT_AMBULATORY_CARE_PROVIDER_SITE_OTHER): Payer: Medicare Other | Admitting: Cardiovascular Disease

## 2013-02-04 ENCOUNTER — Encounter: Payer: Self-pay | Admitting: Cardiovascular Disease

## 2013-02-04 VITALS — BP 120/70 | HR 55 | Ht 68.0 in | Wt 195.0 lb

## 2013-02-04 DIAGNOSIS — I359 Nonrheumatic aortic valve disorder, unspecified: Secondary | ICD-10-CM

## 2013-02-04 DIAGNOSIS — I10 Essential (primary) hypertension: Secondary | ICD-10-CM

## 2013-02-04 DIAGNOSIS — I25810 Atherosclerosis of coronary artery bypass graft(s) without angina pectoris: Secondary | ICD-10-CM

## 2013-02-04 DIAGNOSIS — I2510 Atherosclerotic heart disease of native coronary artery without angina pectoris: Secondary | ICD-10-CM

## 2013-02-04 DIAGNOSIS — E785 Hyperlipidemia, unspecified: Secondary | ICD-10-CM

## 2013-02-04 DIAGNOSIS — E7800 Pure hypercholesterolemia, unspecified: Secondary | ICD-10-CM

## 2013-02-04 DIAGNOSIS — I6529 Occlusion and stenosis of unspecified carotid artery: Secondary | ICD-10-CM

## 2013-02-04 DIAGNOSIS — I658 Occlusion and stenosis of other precerebral arteries: Secondary | ICD-10-CM

## 2013-02-04 DIAGNOSIS — I6523 Occlusion and stenosis of bilateral carotid arteries: Secondary | ICD-10-CM

## 2013-02-04 NOTE — Assessment & Plan Note (Signed)
Carotid artery ultrasound has been ordered given angiogram in 2006 showing severe disease

## 2013-02-04 NOTE — Assessment & Plan Note (Signed)
Currently with no symptoms of angina. No further workup at this time. Continue current medication regimen. 

## 2013-02-04 NOTE — Patient Instructions (Addendum)
You are doing well. No medication changes were made. Monitor blood pressure at home and call the office if running low  We will schedule a carotid ultrasound, for known carotid disease Labs that morning  Please call us if you have new issues that need to be addressed before your next appt.  Your physician wants you to follow-up in: 4 months.  You will receive a reminder letter in the mail two months in advance. If you don't receive a letter, please call our office to schedule the follow-up appointment.

## 2013-02-04 NOTE — Progress Notes (Signed)
Patient ID: [name], male    DOB: 1926/11/27, 79 y.o.   MRN: [id number]  HPI Comments:  [name] is a pleasant 79 or gentleman with history of stroke, now walks with a walker, coronary artery disease, bypass surgery, hypertension, moderate aortic valve stenosis who presents to establish care in the [place] office. He does not take a statin as this caused profound myalgias/myositis (simvastatin). Prior carotid angiogram showing severe disease. He was told he is not a surgical candidate at the time in 2006.  He denies any significant chest pain or shortness of breath. Family is concerned with rare short episodes of dizziness particularly when he gets up in the morning. He takes Vasotec first thing in the morning, followed by Imdur after breakfast. He is on high doses isosorbide for arm discomfort concerning for angina in the past. He denies any recent symptoms of arm discomfort on his current medication regimen. He takes one half dose atenolol, history of bradycardia. Family has not been monitoring his blood pressure at home.  EKG shows normal sinus rhythm with rate 55 beats per minute, nonspecific ST abnormality in the anterolateral leads, inferior leads   Outpatient Encounter Prescriptions as of 02/04/2013  Medication Sig Dispense Refill  . amLODipine (NORVASC) 5 MG tablet ONE-HALF TABLET DAILY  45 tablet  3  . aspirin 81 MG tablet Take 81 mg by mouth daily.        Marland Kitchen atenolol (TENORMIN) 25 MG tablet Take 0.5 tablets (12.5 mg total) by mouth daily.  15 tablet  5  . cholecalciferol (VITAMIN D) 1000 UNITS tablet Take 1,000 Units by mouth daily.        . cyanocobalamin 500 MCG tablet Take 500 mcg by mouth daily.        . furosemide (LASIX) 20 MG tablet TAKE 1 1/2 TABLETS DAILY  135 tablet  3  . isosorbide mononitrate (IMDUR) 120 MG 24 hr tablet TAKE 1 TABLET EVERY DAY  90 tablet  3  . nitroGLYCERIN (NITROSTAT) 0.4 MG SL tablet Place 1 tablet (0.4 mg total) under the tongue every 5 (five)  minutes as needed.  25 tablet  12  . Omega-3 Fatty Acids (FISH OIL) 1000 MG CAPS Take 1 capsule by mouth daily.        Marland Kitchen PLAVIX 75 MG tablet Take 1 tablet (75 mg total) by mouth daily.  90 tablet  3  . VASOTEC 10 MG tablet TAKE 1 TABLET (10 MG TOTAL) BY MOUTH DAILY.  90 tablet  3    Review of Systems  Constitutional: Negative.   HENT: Negative.   Eyes: Negative.   Respiratory: Negative.   Cardiovascular: Negative.   Gastrointestinal: Negative.   Musculoskeletal: Positive for gait problem.  Skin: Negative.   Neurological: Positive for dizziness.  Psychiatric/Behavioral: Negative.   All other systems reviewed and are negative.    BP 120/70  Pulse 55  Ht 5\' 8"  (1.727 m)  Wt 195 lb (88.451 kg)  BMI 29.66 kg/m2  Physical Exam  Nursing note and vitals reviewed. Constitutional: He is oriented to person, place, and time. He appears well-developed and well-nourished.  HENT:  Head: Normocephalic.  Nose: Nose normal.  Mouth/Throat: Oropharynx is clear and moist.  Eyes: Conjunctivae are normal. Pupils are equal, round, and reactive to light.  Neck: Normal range of motion. Neck supple. No JVD present. Carotid bruit is present.  Cardiovascular: Normal rate, regular rhythm and intact distal pulses.  Exam reveals no gallop and no friction rub.  Murmur heard.  Crescendo systolic murmur is present with a grade of 2/6  Pulmonary/Chest: Effort normal and breath sounds normal. No respiratory distress. He has no wheezes. He has no rales. He exhibits no tenderness.  Abdominal: Soft. Bowel sounds are normal. He exhibits no distension. There is no tenderness.  Musculoskeletal: Normal range of motion. He exhibits no edema and no tenderness.  Lymphadenopathy:    He has no cervical adenopathy.  Neurological: He is alert and oriented to person, place, and time. Coordination normal.  Skin: Skin is warm and dry. No rash noted. No erythema.  Psychiatric: He has a normal mood and affect. His behavior  is normal. Judgment and thought content normal.      Assessment and Plan

## 2013-02-04 NOTE — Assessment & Plan Note (Signed)
Has not been checked recently. For starting other medications such as WelChol or zetia, would recommend we recheck his lipid panel.

## 2013-02-04 NOTE — Assessment & Plan Note (Signed)
Moderate aortic valve stenosis by echocardiogram in 2013. We'll continue to monitor periodically

## 2013-02-04 NOTE — Assessment & Plan Note (Signed)
Recent dizzy episodes after standing in the morning concerning for low blood pressure. Family will start monitoring his blood pressure and cough is with numbers.

## 2013-02-09 ENCOUNTER — Encounter (INDEPENDENT_AMBULATORY_CARE_PROVIDER_SITE_OTHER): Payer: 59

## 2013-02-09 ENCOUNTER — Telehealth: Payer: Self-pay

## 2013-02-09 DIAGNOSIS — I6529 Occlusion and stenosis of unspecified carotid artery: Secondary | ICD-10-CM

## 2013-02-09 DIAGNOSIS — E785 Hyperlipidemia, unspecified: Secondary | ICD-10-CM

## 2013-02-09 DIAGNOSIS — R42 Dizziness and giddiness: Secondary | ICD-10-CM

## 2013-02-09 DIAGNOSIS — I6523 Occlusion and stenosis of bilateral carotid arteries: Secondary | ICD-10-CM

## 2013-02-09 NOTE — Telephone Encounter (Signed)
Having trouble finding blood pressures

## 2013-02-09 NOTE — Telephone Encounter (Signed)
Pt dropped off BP readings They are as follows:122/62-186/85, HR=48-61 bpm Will place detailed list in Dr. Windell Hummingbird office for complete review

## 2013-02-10 NOTE — Telephone Encounter (Signed)
They are there now.

## 2013-02-12 NOTE — Telephone Encounter (Signed)
Blood pressures very high in am, better in afternoon evening Would consider changing isosorbide to 60 mg BID rather than 120 mg in AM IF BP still high, Next step could be adding norvasc 5 mg in PM (make this 5 mg BID as well)

## 2013-02-14 ENCOUNTER — Other Ambulatory Visit: Payer: Self-pay

## 2013-02-14 ENCOUNTER — Telehealth: Payer: Self-pay

## 2013-02-14 MED ORDER — ATENOLOL 25 MG PO TABS: 12.5000 mg | ORAL_TABLET | Freq: Every day | ORAL | Status: DC

## 2013-02-14 NOTE — Telephone Encounter (Signed)
error 

## 2013-02-15 ENCOUNTER — Other Ambulatory Visit: Payer: Self-pay

## 2013-02-15 MED ORDER — ISOSORBIDE MONONITRATE ER 120 MG PO TB24: ORAL_TABLET | ORAL | Status: DC

## 2013-02-15 NOTE — Telephone Encounter (Signed)
Wife informed She will have pt  Try isosorbide 60 mg BID, will continue to monitor BPs

## 2013-02-21 ENCOUNTER — Telehealth: Payer: Self-pay

## 2013-02-21 ENCOUNTER — Other Ambulatory Visit: Payer: Self-pay

## 2013-02-21 ENCOUNTER — Ambulatory Visit: Payer: 59 | Admitting: Cardiovascular Disease

## 2013-02-21 DIAGNOSIS — I6522 Occlusion and stenosis of left carotid artery: Secondary | ICD-10-CM

## 2013-02-21 NOTE — Telephone Encounter (Signed)
I called to make appt. They are faxing me referral form Will await form to be faxed and will fax back for appt

## 2013-02-21 NOTE — Telephone Encounter (Signed)
Vascular does not open until 0900 Will try back at that time

## 2013-02-21 NOTE — Telephone Encounter (Signed)
Only Wednesday of this week, next week, Monday, everyday the following week,

## 2013-02-21 NOTE — Telephone Encounter (Signed)
Pt's dtr, Tammy, called. Says pt's wife is concerned about recent episodes of confusion pt has been having when he awakes from naps. Says he has never had this issue before and had 2 episodes this w/e For this reason they want to go ahead and proceed with vascular surgeon referral I will call to schedule in [place] and call Tammy back at [phone]  She is also concerned about some arm pain pt had over w/e Says he has never had CP, but always has arm pain prior to his events Took SL NTG x 1 which relieved this pain  Pt is taking isosorbide 60 mg BID as we discussed for high BPs BP over w/e was 171/73, HR=49, 161/?, HR=49 I advised ok to try increasing isosorbide to 120 mg in am, 60 mg in PM to see if this helps BPs  I told dtr I would make Dr. Mariah Milling aware of the 1 episode of arm pain pt had and will let her know if he wants to make any changes based on these symptoms  Otherwise I will schedule appt with vascular surgeon and call dtr back Understanding verb

## 2013-02-22 NOTE — Telephone Encounter (Signed)
Referral form received Will fax and await response [id number]

## 2013-02-23 ENCOUNTER — Telehealth: Payer: Self-pay

## 2013-02-23 NOTE — Telephone Encounter (Signed)
See below

## 2013-02-23 NOTE — Telephone Encounter (Signed)
See telephone note from 6/25

## 2013-02-23 NOTE — Telephone Encounter (Signed)
Message copied by Marcelle Overlie on Wed Feb 23, 2013  9:59 AM ------      Message from: [place], [place] M      Created: Wed Feb 23, 2013  9:34 AM      Regarding: Appointment Scheduled       Dr. Leonides Sake will see Mr. Prime 06/27 @12noon .  This appointment was arranged with the patient's wife.             ------

## 2013-02-24 ENCOUNTER — Encounter: Payer: Self-pay | Admitting: Vascular Surgery

## 2013-02-24 ENCOUNTER — Other Ambulatory Visit: Payer: Self-pay

## 2013-02-25 ENCOUNTER — Ambulatory Visit (INDEPENDENT_AMBULATORY_CARE_PROVIDER_SITE_OTHER): Payer: Medicare Other | Admitting: Vascular Surgery

## 2013-02-25 ENCOUNTER — Other Ambulatory Visit (INDEPENDENT_AMBULATORY_CARE_PROVIDER_SITE_OTHER): Payer: Medicare Other | Admitting: *Deleted

## 2013-02-25 ENCOUNTER — Encounter: Payer: Self-pay | Admitting: Vascular Surgery

## 2013-02-25 DIAGNOSIS — I6529 Occlusion and stenosis of unspecified carotid artery: Secondary | ICD-10-CM | POA: Insufficient documentation

## 2013-02-25 NOTE — Progress Notes (Signed)
[facility]  New Carotid Patient  Referred by:  Judy Pimple, MD [address] [place] [address] [place], [place] [address]  Reason for referral: B carotid stenosis  History of Present Illness  [name] is a 79 y.o. (1926-11-19) male who presents with chief complaint: worsening L ICA.  By report, the patient previous had a stroke and had carotid angiogram in 2006.  He recent had follow up studies to evaluate his carotid arteries.  A high grade lesion was found on the left side.  Previous carotid studies demonstrated: RICA <40% stenosis, LICA 80-99% stenosis.  Patient has known history of TIA or stroke symptom.  The patient has never had amaurosis fugax or monocular blindness.  The patient has never had facial drooping or hemiplegia.  The patient has had receptive or expressive aphasia.   The patient's previous neurologic deficits have resolved.  He had recently several episodes of confusion and altered mental status.  The patient's risks factors for carotid disease include: hypercholesterolemia and HTN.  Past Medical History  Diagnosis Date  . Coronary artery disease   . Hypercholesterolemia   . Ischemic heart disease   . Hypertension   . Aortic stenosis   . Coronary atherosclerosis of native coronary artery   . Vertigo   . SOB (shortness of breath)   . Swelling of limb   . Coronary atherosclerosis of artery bypass graft   . Carotid artery occlusion   . Aortic valve disorders   . Stroke   . Bradycardia   . Gait abnormality   . Dizziness   . CHF (congestive heart failure)   . Myocardial infarction   . Cancer     prostate. skin, colon    Past Surgical History  Procedure Laterality Date  . Coronary artery bypass graft  1985    extensive 3 vessel disease  . Radium seed implant  1998  . Hemicolectomy    . Carotid angiogram  2006  . Appendectomy    . Tonsillectomy    . Colon surgery      History   Social History  . Marital  Status: Married    Spouse Name: N/A    Number of Children: N/A  . Years of Education: N/A   Occupational History  . retired     since 1987   Social History Main Topics  . Smoking status: Never Smoker   . Smokeless tobacco: Never Used  . Alcohol Use: No  . Drug Use: No  . Sexually Active: Not on file   Other Topics Concern  . Not on file   Social History Narrative  . No narrative on file    Family History  Problem Relation Age of Onset  . Other      Remarkable for heart disease, HTN and high blood pressure  . Heart disease Mother   . Hypertension Mother   . AAA (abdominal aortic aneurysm) Mother   . Heart disease Father   . Heart attack Father    Current Outpatient Prescriptions on File Prior to Visit  Medication Sig Dispense Refill  . amLODipine (NORVASC) 5 MG tablet ONE-HALF TABLET DAILY  45 tablet  3  . aspirin 81 MG tablet Take 81 mg by mouth daily.        Marland Kitchen atenolol (TENORMIN) 25 MG tablet Take 0.5 tablets (12.5 mg total) by mouth daily.  45 tablet  3  . cholecalciferol (VITAMIN D) 1000 UNITS tablet Take 1,000  Units by mouth daily.        . cyanocobalamin 500 MCG tablet Take 500 mcg by mouth daily.        . furosemide (LASIX) 20 MG tablet TAKE 1 1/2 TABLETS DAILY  135 tablet  3  . nitroGLYCERIN (NITROSTAT) 0.4 MG SL tablet Place 1 tablet (0.4 mg total) under the tongue every 5 (five) minutes as needed.  25 tablet  12  . Omega-3 Fatty Acids (FISH OIL) 1000 MG CAPS Take 1 capsule by mouth daily.        Marland Kitchen PLAVIX 75 MG tablet Take 1 tablet (75 mg total) by mouth daily.  90 tablet  3  . VASOTEC 10 MG tablet TAKE 1 TABLET (10 MG TOTAL) BY MOUTH DAILY.  90 tablet  3   No current facility-administered medications on file prior to visit.    Allergies  Allergen Reactions  . Simvastatin     Pt. Has myalgia and myositis when taking this medication.  Marland Kitchen Penicillins     REACTION: rash  . Pravastatin Sodium     REACTION: tongue swelled    REVIEW OF SYSTEMS:  (Positives  checked otherwise negative)  CARDIOVASCULAR:  []  chest pain, []  chest pressure, []  palpitations, []  shortness of breath when laying flat, []  shortness of breath with exertion,  []  pain in feet when walking, []  pain in feet when laying flat, []  history of blood clot in veins (DVT), []  history of phlebitis, []  swelling in legs, []  varicose veins  PULMONARY:  []  productive cough, []  asthma, []  wheezing  NEUROLOGIC:  []  weakness in arms or legs, []  numbness in arms or legs, []  difficulty speaking or slurred speech, []  temporary loss of vision in one eye, []  dizziness  HEMATOLOGIC:  []  bleeding problems, []  problems with blood clotting too easily  MUSCULOSKEL:  []  joint pain, []  joint swelling  GASTROINTEST:  []  vomiting blood, []  blood in stool     GENITOURINARY:  []  burning with urination, []  blood in urine  PSYCHIATRIC:  []  history of major depression  INTEGUMENTARY:  []  rashes, []  ulcers  CONSTITUTIONAL:  []  fever, []  chills  For VQI Use Only  PRE-ADM LIVING: Home  AMB STATUS: Ambulatory with Assistance  CAD Sx: None  PRIOR CHF: Mild  STRESS TEST: [x]  No, [ ]  Normal, [ ]  + ischemia, [ ]  + MI, [ ]  Both  Physical Examination  Filed Vitals:   02/25/13 1240 02/25/13 1242  BP: 151/72 136/58  Pulse: 50   Height: 5\' 8"  (1.727 m)   Weight: 195 lb (88.451 kg)   SpO2: 95%    Body mass index is 29.66 kg/(m^2).  General: A&O x 3, WD, mildly obese, elderly, ill appearing  Head: [place]/AT  Ear/Nose/Throat: Hearing grossly intact, nares w/o erythema or drainage, oropharynx w/o Erythema/Exudate, Mallampati score:   Eyes: PERRLA, EOMI  Neck: Supple, no nuchal rigidity, no palpable LAD  Pulmonary: Sym exp, good air movt, CTAB, no rales, rhonchi, & wheezing  Cardiac: RRR, Nl S1, S2, no Murmurs, rubs or gallops  Vascular: Vessel Right Left  Radial  Palpable Faintly Palpable  Brachial Palpable Faintly Palpable  Carotid Palpable, without bruit Palpable, without bruit  Aorta Not  palpable N/A  Femoral Palpable Palpable  Popliteal Not palpable Not palpable  PT Not Palpable Not Palpable  DP Not Palpable Not Palpable   Gastrointestinal: soft, NTND, -G/R, - HSM, - masses, - CVAT B  Musculoskeletal: M/S 5/5 throughout , Extremities without ischemic changes   Neurologic:  CN 2-12 intact , Pain and light touch intact in extremities , Motor exam as listed above  Psychiatric: Judgment intact, Mood & affect appropriate for pt's clinical situation  Dermatologic: See M/S exam for extremity exam, no rashes otherwise noted  Lymph : No Cervical, Axillary, or Inguinal lymphadenopathy   Non-Invasive Vascular Imaging  L CAROTID DUPLEX (Date: 02/25/2013):   L ICA stenosis: >80% cannot visualize normal segment distally  Outside Studies/Documentation 20 pages of outside documents were reviewed including: outpatient clinic and prior carotid angiogram and duplex  Medical Decision Making  Rye Decoste Buelna is a 79 y.o. male who presents with: multiple high grade co-morbidities, possible high grade intracranial R ICA stenosis, high grade L ICA stenosis.   Based on the patient's vascular studies and examination, I have offered the patient: B carotid and cerebral angiogram. I discussed with the patient the nature of angiographic procedures, especially the limited patencies of any endovascular intervention.  The patient is aware of that the risks of an angiographic procedure include but are not limited to: bleeding, infection, access site complications, renal failure, embolization, rupture of vessel, dissection, possible need for emergent surgical intervention, possible need for surgical procedures to treat the patient's pathology, anaphylactic reaction to contrast, and stroke and death.   Cerebral and carotid angiogram is associated with ~1% stroke rate.  I discussed with this patient the risk with carotid endarterectomy.  I suspect he would be a mod-high risk.  Additionally, I'm not  certain the LICA stenosis can be addressed as no end point can be visualized.  The angiogram is necessary to plan any intervention in this case.  CREST suggests substantially worse outcomes with CAS in patient >33 years old (stroke 3-4%), so I doubt stenting is an option also.  At this point, the patient is NOT INTERESTED in proceeding with the carotid angiogram.  He is willing to forgo any subsequent carotid intervention so no further surveillance is needed.  He has elected medical ONLY management of his carotid disease.  He is aware of the risk of stroke and subsequent death.  I found him to be of sound mind, so I don't think this choice is inappropriate in this patient.    I discussed in depth with the patient the nature of atherosclerosis, and emphasized the importance of maximal medical management including strict control of blood pressure, blood glucose, and lipid levels, obtaining regular exercise, antiplatelet agents, and cessation of smoking.    The patient is currently on an antiplatelet: ASA , Plavix.    The patient is currently not on a statin: due to medical contraindication.  The patient is aware that without maximal medical management the underlying atherosclerotic disease process will progress, limiting the benefit of any interventions.  Thank you for allowing [place] to participate in this patient's care.  Leonides Sake, MD [facility] Office: [phone] Pager: [phone]  02/25/2013, 5:53 PM

## 2013-03-08 ENCOUNTER — Ambulatory Visit (INDEPENDENT_AMBULATORY_CARE_PROVIDER_SITE_OTHER): Payer: Medicare Other

## 2013-03-08 DIAGNOSIS — I25810 Atherosclerosis of coronary artery bypass graft(s) without angina pectoris: Secondary | ICD-10-CM

## 2013-03-08 DIAGNOSIS — I6523 Occlusion and stenosis of bilateral carotid arteries: Secondary | ICD-10-CM

## 2013-03-08 DIAGNOSIS — I6529 Occlusion and stenosis of unspecified carotid artery: Secondary | ICD-10-CM

## 2013-03-08 DIAGNOSIS — I658 Occlusion and stenosis of other precerebral arteries: Secondary | ICD-10-CM

## 2013-03-08 DIAGNOSIS — E785 Hyperlipidemia, unspecified: Secondary | ICD-10-CM

## 2013-03-09 LAB — LIPID PANEL
Cholesterol, Total: 219 mg/dL — ABNORMAL HIGH (ref 100–199)
Triglycerides: 86 mg/dL (ref 0–149)
VLDL Cholesterol Cal: 17 mg/dL (ref 5–40)

## 2013-03-09 LAB — HEPATIC FUNCTION PANEL
AST: 19 IU/L (ref 0–40)
Albumin: 4 g/dL (ref 3.5–4.7)
Alkaline Phosphatase: 85 IU/L (ref 39–117)
Total Bilirubin: 0.7 mg/dL (ref 0.0–1.2)

## 2013-03-15 ENCOUNTER — Other Ambulatory Visit: Payer: Self-pay

## 2013-03-15 MED ORDER — EZETIMIBE 10 MG PO TABS: 10.0000 mg | ORAL_TABLET | Freq: Every day | ORAL | Status: DC

## 2013-04-06 ENCOUNTER — Other Ambulatory Visit: Payer: Self-pay

## 2013-04-13 ENCOUNTER — Telehealth: Payer: Self-pay

## 2013-04-13 NOTE — Telephone Encounter (Signed)
Pt daughter called and states he needs a medicaiton change. Please call

## 2013-04-13 NOTE — Telephone Encounter (Signed)
Pt would like refill of isosorbide, but with increased dose at last visit of 180mg , #90 w/ 3 refills.

## 2013-04-15 ENCOUNTER — Other Ambulatory Visit: Payer: Self-pay

## 2013-04-15 ENCOUNTER — Telehealth: Payer: Self-pay | Admitting: *Deleted

## 2013-04-15 MED ORDER — ISOSORBIDE MONONITRATE ER 120 MG PO TB24: ORAL_TABLET | ORAL | Status: DC

## 2013-04-15 NOTE — Telephone Encounter (Signed)
A new Rx sent for Imdur 120 mg take one tablet in the am with 60 mg in the pm.

## 2013-04-15 NOTE — Telephone Encounter (Signed)
Patient will take isosorbide mono for 120 mg in the am with 60 mg in the pm 90 day supply.

## 2013-04-15 NOTE — Telephone Encounter (Signed)
Please advise 

## 2013-04-15 NOTE — Telephone Encounter (Signed)
Please advise if patient needs to be on the Imdur 180 mg.

## 2013-04-15 NOTE — Telephone Encounter (Signed)
Error

## 2013-04-15 NOTE — Telephone Encounter (Signed)
Isorb 180mg  90 days with refills

## 2013-04-18 ENCOUNTER — Other Ambulatory Visit: Payer: Self-pay | Admitting: Cardiology

## 2013-04-18 ENCOUNTER — Telehealth: Payer: Self-pay

## 2013-04-18 NOTE — Telephone Encounter (Signed)
Spoke with pharmacist and she is aware that pt is to take Imdur 120 mg qd in the am and 1/2 tablet 60 mg qd in the pm.

## 2013-04-18 NOTE — Telephone Encounter (Signed)
Pharmacist has question regarding imdur.please call

## 2013-04-21 ENCOUNTER — Other Ambulatory Visit: Payer: Self-pay | Admitting: Cardiology

## 2013-05-20 ENCOUNTER — Ambulatory Visit (INDEPENDENT_AMBULATORY_CARE_PROVIDER_SITE_OTHER): Payer: Medicare Other

## 2013-05-20 ENCOUNTER — Ambulatory Visit: Payer: Medicare Other

## 2013-05-20 DIAGNOSIS — Z23 Encounter for immunization: Secondary | ICD-10-CM

## 2013-06-24 ENCOUNTER — Ambulatory Visit (INDEPENDENT_AMBULATORY_CARE_PROVIDER_SITE_OTHER): Payer: Medicare Other | Admitting: Cardiovascular Disease

## 2013-06-24 ENCOUNTER — Encounter: Payer: Self-pay | Admitting: Cardiovascular Disease

## 2013-06-24 VITALS — BP 122/64 | HR 58 | Ht 68.0 in | Wt 197.2 lb

## 2013-06-24 DIAGNOSIS — I359 Nonrheumatic aortic valve disorder, unspecified: Secondary | ICD-10-CM

## 2013-06-24 DIAGNOSIS — I25810 Atherosclerosis of coronary artery bypass graft(s) without angina pectoris: Secondary | ICD-10-CM

## 2013-06-24 DIAGNOSIS — I10 Essential (primary) hypertension: Secondary | ICD-10-CM

## 2013-06-24 DIAGNOSIS — I6529 Occlusion and stenosis of unspecified carotid artery: Secondary | ICD-10-CM

## 2013-06-24 DIAGNOSIS — E7800 Pure hypercholesterolemia, unspecified: Secondary | ICD-10-CM

## 2013-06-24 DIAGNOSIS — I200 Unstable angina: Secondary | ICD-10-CM

## 2013-06-24 MED ORDER — PLAVIX 75 MG PO TABS: 75.0000 mg | ORAL_TABLET | Freq: Every day | ORAL | Status: DC

## 2013-06-24 MED ORDER — NITROGLYCERIN 0.4 MG SL SUBL: 0.4000 mg | SUBLINGUAL_TABLET | SUBLINGUAL | Status: DC | PRN

## 2013-06-24 MED ORDER — AMLODIPINE BESYLATE 5 MG PO TABS: ORAL_TABLET | ORAL | Status: DC

## 2013-06-24 NOTE — Assessment & Plan Note (Signed)
Family reports that his arm pain is his typical anginal equivalent. Therefore worsening arm pain in the past 1-1/2 weeks. Not particularly interested in cardiac catheterization. They prefer to continue to use nitroglycerin sublingual as this does relieve his pain. He does not have pain with exertion such as walking with a walker. We have suggested he contact the office if symptoms get worse. If needed, high risk catheterization could be performed.

## 2013-06-24 NOTE — Assessment & Plan Note (Addendum)
Previous history of dizziness, not reported on today's visit. No new symptoms of TIA or stroke. Continue aspirin and Plavix.

## 2013-06-24 NOTE — Assessment & Plan Note (Signed)
They were not particularly interested in starting WelChol or zetia. Marland Kitchen he'll continue fiber/oatmeal, possibly add Citrucel.

## 2013-06-24 NOTE — Progress Notes (Signed)
Patient ID: [name], male    DOB: [birth year], 79 y.o.   MRN: [id number]  HPI Comments:  [name] is a pleasant 79 -year-old gentleman with history of stroke, now walks with a walker, coronary artery disease, bypass surgery, hypertension, moderate aortic valve stenosis who presents for routine followup. Family reports bypass in 1985, PCI at [facility] in the past, last catheterization in 2010. Results are not any system.   He does not take a statin as this caused profound myalgias/myositis (simvastatin). Prior carotid angiogram showing severe disease. He was told he is not a surgical candidate at the time in 2006.  On his visit today, his family reports that he has arm pain when he dresses himself in the morning. He does not have any arm pain or chest pain with exertion when he walks with his walker. He does not walk very much and sleeps in a recliner. Day monitor blood pressure occasionally and it appears to be labile,, mean 130 systolic range, occasionally 100, occasionally 160. Very few numbers provided. He is otherwise eating well. In good spirits.  He denies any significant chest pain or shortness of breath. On prior visits, he had dizziness with standing in the morning. No complaints of dizziness on today's visit. He takes Vasotec first thing in the morning, followed by Imdur after breakfast. He is on high doses isosorbide for arm discomfort concerning for angina in the past. He takes one half dose atenolol, history of bradycardia.  EKG shows normal sinus rhythm with rate 58 beats per minute, diffuse ST and T wave abnormality in V1 through V6, 2, 3, aVF (slightly worse than prior EKGs in 2013,  Particularly in V1 and V2)   Outpatient Encounter Prescriptions as of 06/24/2013  Medication Sig Dispense Refill  . amLODipine (NORVASC) 5 MG tablet ONE-HALF TABLET DAILY  45 tablet  3  . aspirin 81 MG tablet Take 81 mg by mouth daily.        Marland Kitchen atenolol (TENORMIN) 25 MG tablet Take 0.5  tablets (12.5 mg total) by mouth daily.  45 tablet  3  . cholecalciferol (VITAMIN D) 1000 UNITS tablet Take 1,000 Units by mouth daily.        . cyanocobalamin 500 MCG tablet Take 500 mcg by mouth daily.        . furosemide (LASIX) 20 MG tablet TAKE 1 1/2 TABLETS DAILY  135 tablet  3  . isosorbide mononitrate (IMDUR) 120 MG 24 hr tablet Take 120 mg in the am with 60 mg in the pm.  135 tablet  3  . nitroGLYCERIN (NITROSTAT) 0.4 MG SL tablet Place 1 tablet (0.4 mg total) under the tongue every 5 (five) minutes as needed.  25 tablet  12  . Omega-3 Fatty Acids (FISH OIL) 1000 MG CAPS Take 1 capsule by mouth daily.        Marland Kitchen PLAVIX 75 MG tablet Take 1 tablet (75 mg total) by mouth daily.  90 tablet  3  . VASOTEC 10 MG tablet TAKE 1 TABLET (10 MG TOTAL) BY MOUTH DAILY.  90 tablet  3  . [DISCONTINUED] ezetimibe (ZETIA) 10 MG tablet Take 1 tablet (10 mg total) by mouth daily.  90 tablet  3   No facility-administered encounter medications on file as of 06/24/2013.     Review of Systems  Constitutional: Negative.   HENT: Negative.   Eyes: Negative.   Respiratory: Negative.   Cardiovascular: Negative.   Gastrointestinal: Negative.   Endocrine: Negative.  Musculoskeletal: Positive for gait problem.       Bilateral arm pain when dressing in the morning  Skin: Negative.   Allergic/Immunologic: Negative.   Hematological: Negative.   Psychiatric/Behavioral: Negative.   All other systems reviewed and are negative.    BP 122/64  Pulse 58  Ht 5\' 8"  (1.727 m)  Wt 197 lb 4 oz (89.472 kg)  BMI 30 kg/m2  Physical Exam  Nursing note and vitals reviewed. Constitutional: He is oriented to person, place, and time. He appears well-developed and well-nourished.  HENT:  Head: Normocephalic.  Nose: Nose normal.  Mouth/Throat: Oropharynx is clear and moist.  Eyes: Conjunctivae are normal. Pupils are equal, round, and reactive to light.  Neck: Normal range of motion. Neck supple. No JVD present.  Carotid bruit is present.  Cardiovascular: Normal rate, regular rhythm, S1 normal, S2 normal and intact distal pulses.  Exam reveals no gallop and no friction rub.   Murmur heard.  Crescendo systolic murmur is present with a grade of 2/6  Pulmonary/Chest: Effort normal and breath sounds normal. No respiratory distress. He has no wheezes. He has no rales. He exhibits no tenderness.  Abdominal: Soft. Bowel sounds are normal. He exhibits no distension. There is no tenderness.  Musculoskeletal: Normal range of motion. He exhibits no edema and no tenderness.  Lymphadenopathy:    He has no cervical adenopathy.  Neurological: He is alert and oriented to person, place, and time. Coordination normal.  Skin: Skin is warm and dry. No rash noted. No erythema.  Psychiatric: He has a normal mood and affect. His behavior is normal. Judgment and thought content normal.      Assessment and Plan

## 2013-06-24 NOTE — Patient Instructions (Signed)
You are doing well. No medication changes were made.  OK to take nitro as needed for chest pain  Please call us if you have new issues that need to be addressed before your next appt.  Your physician wants you to follow-up in: 3 months.  You will receive a reminder letter in the mail two months in advance. If you don't receive a letter, please call our office to schedule the follow-up appointment.

## 2013-06-24 NOTE — Assessment & Plan Note (Signed)
We have suggested the family closely monitor his blood pressure.

## 2013-06-24 NOTE — Assessment & Plan Note (Signed)
Moderate aortic valve stenosis. This may also contribute to unstable anginal symptoms. This was explained to the family

## 2013-07-08 ENCOUNTER — Other Ambulatory Visit: Payer: Self-pay | Admitting: Cardiology

## 2013-08-05 ENCOUNTER — Ambulatory Visit: Payer: Medicare Other | Admitting: Family Medicine

## 2013-08-05 ENCOUNTER — Ambulatory Visit (INDEPENDENT_AMBULATORY_CARE_PROVIDER_SITE_OTHER): Payer: Medicare Other | Admitting: Podiatry

## 2013-08-05 ENCOUNTER — Ambulatory Visit: Payer: Self-pay | Admitting: Podiatry

## 2013-08-05 ENCOUNTER — Encounter: Payer: Self-pay | Admitting: Podiatry

## 2013-08-05 VITALS — BP 141/74 | HR 57 | Resp 16 | Ht 67.0 in | Wt 185.0 lb

## 2013-08-05 DIAGNOSIS — B351 Tinea unguium: Secondary | ICD-10-CM

## 2013-08-05 DIAGNOSIS — M79609 Pain in unspecified limb: Secondary | ICD-10-CM

## 2013-08-05 DIAGNOSIS — L600 Ingrowing nail: Secondary | ICD-10-CM

## 2013-08-06 NOTE — Progress Notes (Signed)
Subjective:     Patient ID: [name], male   DOB: 04/18/1927, 79 y.o.   MRN: [id number]  HPI patient presents with caregiver stating that he has a very bad big toenail left it's been painful and has nail disease of his remaining nails that are impossible to cut and pain when pressed   Review of Systems  All other systems reviewed and are negative.       Objective:   Physical Exam  Nursing note and vitals reviewed. Constitutional: He is oriented to person, place, and time.  Musculoskeletal: Normal range of motion.  Neurological: He is oriented to person, place, and time.  Skin: Skin is dry.   patient is found to have severely thickened nailbeds 1-5 both feet with dystrophic changes to the big toenail left with looseness the nail and pain when pressed. Patient's neurovascular status is diminished with diminished hair growth and shiny skin condition     Assessment:     Severe mycotic nail infection 1-5 both feet with damage big toenail left with vascular disease present    Plan:     Reviewed vascular disease and they state they cedar cardiologist for this and today have recommended temporary removal of the big toenail left and removal of remaining nails by debridement. They understand risks and at this time I infiltrated left hallux 60 mg Xylocaine Marcaine mixture and with sterile instrumentation remove the left hallux nail and applied sterile dressing. Debridement remaining nails with no iatrogenic bleeding noted and reappoint for routine care

## 2013-08-18 ENCOUNTER — Telehealth: Payer: Self-pay | Admitting: *Deleted

## 2013-08-18 NOTE — Telephone Encounter (Signed)
Lmom to call our office. Time to schedule Carotid u/s plus f/u with doctor.

## 2013-09-02 ENCOUNTER — Ambulatory Visit: Payer: Self-pay | Admitting: Podiatry

## 2013-09-08 ENCOUNTER — Other Ambulatory Visit: Payer: Self-pay | Admitting: Cardiology

## 2013-09-20 ENCOUNTER — Ambulatory Visit: Payer: Medicare Other | Admitting: Cardiovascular Disease

## 2013-09-22 ENCOUNTER — Ambulatory Visit: Payer: Medicare Other | Admitting: Cardiovascular Disease

## 2013-09-23 ENCOUNTER — Ambulatory Visit: Payer: Medicare Other | Admitting: Cardiovascular Disease

## 2013-10-05 ENCOUNTER — Other Ambulatory Visit: Payer: Self-pay | Admitting: Cardiology

## 2013-10-13 ENCOUNTER — Ambulatory Visit: Payer: Medicare Other | Admitting: Cardiovascular Disease

## 2013-12-05 ENCOUNTER — Telehealth: Payer: Self-pay

## 2013-12-05 NOTE — Telephone Encounter (Signed)
Has questions regarding Amlodipine. Needs new directions.

## 2013-12-05 NOTE — Telephone Encounter (Signed)
Pt has appt w/ Dr. Rockey Situ on 12/09/13 to discuss possible med changes or directions.

## 2013-12-07 ENCOUNTER — Other Ambulatory Visit: Payer: Self-pay

## 2013-12-07 MED ORDER — AMLODIPINE BESYLATE 5 MG PO TABS: ORAL_TABLET | ORAL | Status: DC

## 2013-12-07 NOTE — Telephone Encounter (Signed)
Pt daugter called and states pt has been taking 1 pill instead of 1/2 daily

## 2013-12-09 ENCOUNTER — Ambulatory Visit (INDEPENDENT_AMBULATORY_CARE_PROVIDER_SITE_OTHER): Payer: Medicare Other | Admitting: Cardiovascular Disease

## 2013-12-09 ENCOUNTER — Other Ambulatory Visit: Payer: Self-pay | Admitting: Cardiovascular Disease

## 2013-12-09 ENCOUNTER — Encounter: Payer: Self-pay | Admitting: Cardiovascular Disease

## 2013-12-09 VITALS — BP 102/62 | HR 85 | Ht 68.0 in | Wt 205.2 lb

## 2013-12-09 DIAGNOSIS — I2510 Atherosclerotic heart disease of native coronary artery without angina pectoris: Secondary | ICD-10-CM

## 2013-12-09 DIAGNOSIS — I25810 Atherosclerosis of coronary artery bypass graft(s) without angina pectoris: Secondary | ICD-10-CM

## 2013-12-09 DIAGNOSIS — R0602 Shortness of breath: Secondary | ICD-10-CM

## 2013-12-09 DIAGNOSIS — M79602 Pain in left arm: Secondary | ICD-10-CM

## 2013-12-09 DIAGNOSIS — I6529 Occlusion and stenosis of unspecified carotid artery: Secondary | ICD-10-CM

## 2013-12-09 DIAGNOSIS — M79601 Pain in right arm: Secondary | ICD-10-CM

## 2013-12-09 DIAGNOSIS — M79609 Pain in unspecified limb: Secondary | ICD-10-CM

## 2013-12-09 DIAGNOSIS — R609 Edema, unspecified: Secondary | ICD-10-CM

## 2013-12-09 DIAGNOSIS — I10 Essential (primary) hypertension: Secondary | ICD-10-CM

## 2013-12-09 DIAGNOSIS — I359 Nonrheumatic aortic valve disorder, unspecified: Secondary | ICD-10-CM

## 2013-12-09 LAB — BASIC METABOLIC PANEL
ANION GAP: 5 — AB (ref 7–16)
BUN: 17 mg/dL (ref 7–18)
CALCIUM: 8.8 mg/dL (ref 8.5–10.1)
CHLORIDE: 98 mmol/L (ref 98–107)
CO2: 28 mmol/L (ref 21–32)
Creatinine: 1.27 mg/dL (ref 0.60–1.30)
GFR CALC AF AMER: 60 — AB
GFR CALC NON AF AMER: 52 — AB
Glucose: 95 mg/dL (ref 65–99)
OSMOLALITY: 264 (ref 275–301)
Potassium: 4 mmol/L (ref 3.5–5.1)
SODIUM: 131 mmol/L — AB (ref 136–145)

## 2013-12-09 LAB — PRO B NATRIURETIC PEPTIDE: B-Type Natriuretic Peptide: 2267 pg/mL — ABNORMAL HIGH (ref 0–450)

## 2013-12-09 MED ORDER — FUROSEMIDE 20 MG PO TABS: 20.0000 mg | ORAL_TABLET | Freq: Three times a day (TID) | ORAL | Status: AC

## 2013-12-09 MED ORDER — NITROGLYCERIN 0.4 MG SL SUBL: 0.4000 mg | SUBLINGUAL_TABLET | SUBLINGUAL | Status: AC | PRN

## 2013-12-09 NOTE — Progress Notes (Signed)
Patient ID: [name], male    DOB: 04-27-1927, 80 y.o.   MRN: [id number]  HPI Comments:  [name] is a pleasant 80 -year-old gentleman with history of stroke, weak legs, minimal walking with a walker, coronary artery disease, bypass surgery, hypertension, moderate aortic valve stenosis who presents for routine followup. Family reports bypass in 1985, PCI at [facility] in the past, last catheterization in 2010. Results are not any system. History of bradycardia   He does not take a statin as this caused profound myalgias/myositis (simvastatin). Prior carotid angiogram showing severe disease. He was told he is not a surgical candidate at the time in 2006.  On his visit today, his family reports that he continues to have  arm pain when he dresses himself in the morning. He is not walking much with a walker apart from going to the bathroom. Today he was excited to come to the office, possible anxiety. He started having arm pain in the bathroom at home.  He took 4 nitroglycerin. Blood pressure is low in the office today. He is asymptomatic. Currently arm pain has resolved.  he uses one to 2 nitroglycerin on a regular basis for arm pain typically when he is in the bathroom getting dressed. Blood pressure numbers range from 725-366 systolic . sleeps in a recliner.  He is otherwise eating well. In good spirits.  He denies any significant chest pain or shortness of breath. No complaints of dizziness on today's visit.  EKG shows normal sinus rhythm with rate 85 beats per minute, diffuse ST and T wave abnormality in V1 through V6, 2, 3, aVF   Outpatient Encounter Prescriptions as of 12/09/2013  Medication Sig  . amLODipine (NORVASC) 5 MG tablet Take 1 tablet by mouth daily.  Marland Kitchen aspirin 81 MG tablet Take 81 mg by mouth daily.    Marland Kitchen atenolol (TENORMIN) 25 MG tablet Take 0.5 tablets (12.5 mg total) by mouth daily.  . cholecalciferol (VITAMIN D) 1000 UNITS tablet Take 1,000 Units by mouth daily.     . cyanocobalamin 500 MCG tablet Take 500 mcg by mouth daily.    . furosemide (LASIX) 20 MG tablet TAKE 1 1/2 TABLETS DAILY  . isosorbide mononitrate (IMDUR) 120 MG 24 hr tablet Take 120 mg in the am with 60 mg in the pm.  . nitroGLYCERIN (NITROSTAT) 0.4 MG SL tablet Place 1 tablet (0.4 mg total) under the tongue every 5 (five) minutes as needed.  . Omega-3 Fatty Acids (FISH OIL) 1000 MG CAPS Take 1 capsule by mouth daily.    Marland Kitchen PLAVIX 75 MG tablet Take 1 tablet (75 mg total) by mouth daily.  Marland Kitchen VASOTEC 10 MG tablet TAKE 1 TABLET (10 MG TOTAL) BY MOUTH DAILY.    Review of Systems  Constitutional: Negative.   HENT: Negative.   Eyes: Negative.   Respiratory: Negative.   Cardiovascular: Negative.   Gastrointestinal: Negative.   Endocrine: Negative.   Musculoskeletal: Positive for gait problem.       Bilateral arm pain when dressing in the morning  Skin: Negative.   Allergic/Immunologic: Negative.   Neurological: Negative.   Hematological: Negative.   Psychiatric/Behavioral: Negative.   All other systems reviewed and are negative.   BP 102/62  Pulse 85  Ht 5\' 8"  (1.727 m)  Wt 205 lb 4 oz (93.101 kg)  BMI 31.22 kg/m2  Physical Exam  Nursing note and vitals reviewed. Constitutional: He is oriented to person, place, and time. He appears well-developed and well-nourished.  HENT:  Head: Normocephalic.  Nose: Nose normal.  Mouth/Throat: Oropharynx is clear and moist.  Eyes: Conjunctivae are normal. Pupils are equal, round, and reactive to light.  Neck: Normal range of motion. Neck supple. No JVD present. Carotid bruit is present.  Cardiovascular: Normal rate, regular rhythm, S1 normal, S2 normal and intact distal pulses.  Exam reveals no gallop and no friction rub.   Murmur heard.  Crescendo systolic murmur is present with a grade of 2/6  Pulmonary/Chest: Effort normal and breath sounds normal. No respiratory distress. He has no wheezes. He has no rales. He exhibits no tenderness.   Abdominal: Soft. Bowel sounds are normal. He exhibits no distension. There is no tenderness.  Musculoskeletal: Normal range of motion. He exhibits no edema and no tenderness.  Lymphadenopathy:    He has no cervical adenopathy.  Neurological: He is alert and oriented to person, place, and time. Coordination normal.  Skin: Skin is warm and dry. No rash noted. No erythema.  Psychiatric: He has a normal mood and affect. His behavior is normal. Judgment and thought content normal.      Assessment and Plan

## 2013-12-09 NOTE — Assessment & Plan Note (Signed)
Low blood pressure on today's visit likely from nitroglycerin x4 this morning for arm pain. Suggested the family continue to monitor pressure. Typically this ranges from 110 up to 160

## 2013-12-09 NOTE — Assessment & Plan Note (Signed)
Severe carotid disease in the past. Unable to tolerate statins. Poor surgical candidate

## 2013-12-09 NOTE — Assessment & Plan Note (Signed)
Moderate aortic valve stenosis in 2013. He's not very active at baseline. No significant shortness of breath symptoms with exertion. Unable to exclude arm pain as his anginal equivalent. Family not particularly interested in aggressive surgical intervention at this time

## 2013-12-09 NOTE — Assessment & Plan Note (Signed)
Chronic edema possibly exacerbated by amlodipine. Appears to be chronic venous insufficiency though unable to exclude heart failure. Lab work today with BNP. If symptoms get worse, could take extra Lasix at noon

## 2013-12-09 NOTE — Assessment & Plan Note (Addendum)
Chronic arm pain, worse today. Family feels this was from the excitement of taking a trip out of the home. Recommended he closely monitor his arm pain symptoms and suggested they call if symptoms do not improve in the next few days. They're not particularly interested in cardiac catheterization. Could consider starting ranexa.

## 2013-12-09 NOTE — Patient Instructions (Signed)
You are doing well. No medication changes were made.  Please call us if you have new issues that need to be addressed before your next appt.  Your physician wants you to follow-up in: 6 months.  You will receive a reminder letter in the mail two months in advance. If you don't receive a letter, please call our office to schedule the follow-up appointment.   

## 2013-12-12 ENCOUNTER — Encounter: Payer: Self-pay | Admitting: Cardiovascular Disease

## 2013-12-16 ENCOUNTER — Ambulatory Visit: Payer: Medicare Other | Admitting: Cardiovascular Disease

## 2014-01-30 ENCOUNTER — Other Ambulatory Visit: Payer: Self-pay | Admitting: *Deleted

## 2014-01-30 MED ORDER — AMLODIPINE BESYLATE 5 MG PO TABS: ORAL_TABLET | ORAL | Status: DC

## 2014-01-30 NOTE — Telephone Encounter (Signed)
Requested Prescriptions   Signed Prescriptions Disp Refills  . amLODipine (NORVASC) 5 MG tablet 90 tablet 3    Sig: Take 1 tablet by mouth daily.    Authorizing Provider: Minna Merritts    Ordering User: Britt Bottom

## 2014-02-17 ENCOUNTER — Other Ambulatory Visit: Payer: Self-pay

## 2014-02-17 MED ORDER — AMLODIPINE BESYLATE 5 MG PO TABS: ORAL_TABLET | ORAL | Status: AC

## 2014-03-21 ENCOUNTER — Ambulatory Visit (INDEPENDENT_AMBULATORY_CARE_PROVIDER_SITE_OTHER): Payer: Medicare Other | Admitting: Podiatry

## 2014-03-21 VITALS — BP 123/65 | HR 64 | Resp 16

## 2014-03-21 DIAGNOSIS — M79609 Pain in unspecified limb: Secondary | ICD-10-CM

## 2014-03-21 DIAGNOSIS — M79673 Pain in unspecified foot: Secondary | ICD-10-CM

## 2014-03-21 DIAGNOSIS — B351 Tinea unguium: Secondary | ICD-10-CM

## 2014-03-23 NOTE — Progress Notes (Signed)
Subjective:     Patient ID: [name], male   DOB: 1927-05-06, 80 y.o.   MRN: [id number]  HPI patient presents with thick yellow nailbeds 1-5 both feet that are impossible for him to take care of or his caregivers   Review of Systems     Objective:   Physical Exam Neurovascular status unchanged with thick yellow nailbeds 1-5 both feet that are painful when pressed and brittle and yellow in appearance    Assessment:     Mycotic nail infection with pain 1-5 of both feet    Plan:     Debris painful nailbeds 1-5 both feet with no iatrogenic bleeding noted

## 2014-04-07 ENCOUNTER — Other Ambulatory Visit: Payer: Self-pay | Admitting: Cardiovascular Disease

## 2014-04-13 ENCOUNTER — Other Ambulatory Visit: Payer: Self-pay | Admitting: Cardiovascular Disease

## 2014-06-02 ENCOUNTER — Ambulatory Visit: Payer: Medicare Other | Admitting: Cardiovascular Disease

## 2014-07-06 ENCOUNTER — Other Ambulatory Visit: Payer: Self-pay | Admitting: Cardiovascular Disease

## 2014-07-07 ENCOUNTER — Ambulatory Visit: Payer: Medicare Other

## 2014-07-07 ENCOUNTER — Encounter: Payer: Self-pay | Admitting: Cardiovascular Disease

## 2014-07-07 ENCOUNTER — Ambulatory Visit (INDEPENDENT_AMBULATORY_CARE_PROVIDER_SITE_OTHER): Payer: Medicare Other | Admitting: Cardiovascular Disease

## 2014-07-07 VITALS — BP 130/70 | HR 56 | Ht 67.5 in | Wt 208.0 lb

## 2014-07-07 DIAGNOSIS — I25700 Atherosclerosis of coronary artery bypass graft(s), unspecified, with unstable angina pectoris: Secondary | ICD-10-CM

## 2014-07-07 DIAGNOSIS — I209 Angina pectoris, unspecified: Secondary | ICD-10-CM

## 2014-07-07 DIAGNOSIS — I350 Nonrheumatic aortic (valve) stenosis: Secondary | ICD-10-CM

## 2014-07-07 DIAGNOSIS — I10 Essential (primary) hypertension: Secondary | ICD-10-CM

## 2014-07-07 DIAGNOSIS — Z23 Encounter for immunization: Secondary | ICD-10-CM

## 2014-07-07 DIAGNOSIS — R609 Edema, unspecified: Secondary | ICD-10-CM

## 2014-07-07 DIAGNOSIS — R600 Localized edema: Secondary | ICD-10-CM

## 2014-07-07 DIAGNOSIS — I2510 Atherosclerotic heart disease of native coronary artery without angina pectoris: Secondary | ICD-10-CM

## 2014-07-07 DIAGNOSIS — I6522 Occlusion and stenosis of left carotid artery: Secondary | ICD-10-CM

## 2014-07-07 MED ORDER — ATENOLOL 25 MG PO TABS: 25.0000 mg | ORAL_TABLET | Freq: Every day | ORAL | Status: AC

## 2014-07-07 NOTE — Assessment & Plan Note (Signed)
Moderate aortic valve stenosis in 2013. Family is recommending medical management. He is relatively sedentary, currently with no symptoms of near syncope or chest pain

## 2014-07-07 NOTE — Patient Instructions (Addendum)
Your next appointment will be scheduled in our new office located at :  [place]  [address], [place], [place] [address]  You are doing well. No medication changes were made. Extra lasix as needed for worsening leg swelling  We will check labs today, Flu shot  Please call us if you have new issues that need to be addressed before your next appt.  Your physician wants you to follow-up in: 6 months.  You will receive a reminder letter in the mail two months in advance. If you don't receive a letter, please call our office to schedule the follow-up appointment.

## 2014-07-07 NOTE — Progress Notes (Signed)
Patient ID: [name], male    DOB: 12/30/1926, 80 y.o.   MRN: [id number]  HPI Comments:  [name] is a pleasant 61 -year-old gentleman with history of stroke, weak legs, chronic lower extremity edema, coronary artery disease, bypass surgery, hypertension, moderate aortic valve stenosis in 2013,who presents for routine followup. bypass in 1985, PCI at [facility] in the past, last catheterization in 2010.  History of bradycardia  He does not take a statin as this caused profound myalgias/myositis (simvastatin).  In follow-up today, he wears compression hose on a regular basis. He continues to have chronic lower extremity edema. He takes atenolol 12.5 mrem daily. Lasix 20 mg twice a day.  Wife reports if he takes more Lasix, legs get weaker. He is needing significant assistance from his wife and daughter for his ADLs. Blood pressure has been relatively stable. Denies having any lightheadedness or dizziness. He does not do any regular exercise On prior office visit had significant arm pain. Occasionally he has arm pain with using his walker. Takes nitroglycerin on a regular basis for various aches and pains.  sleeps in a recliner.  He is otherwise eating well. In good spirits.  EKG on today's visit shows normal sinus rhythm with rate 56 bpm, ST and T-wave abnormality diffusely  Other past medical history Prior carotid angiogram showing severe disease on the left. He was told he is not a surgical candidate at the time in 2006. Last carotid ultrasound in 2014    Outpatient Encounter Prescriptions as of 07/07/2014  Medication Sig  . amLODipine (NORVASC) 5 MG tablet Take 1 tablet by mouth daily.  Marland Kitchen aspirin 81 MG tablet Take 81 mg by mouth daily.    Marland Kitchen atenolol (TENORMIN) 25 MG tablet Take 0.5 tablets (12.5 mg total) by mouth daily.  . cholecalciferol (VITAMIN D) 1000 UNITS tablet Take 1,000 Units by mouth daily.    . cyanocobalamin 500 MCG tablet Take 500 mcg by mouth daily.    .  furosemide (LASIX) 20 MG tablet Take 1 tablet (20 mg total) by mouth 3 (three) times daily.  . isosorbide mononitrate (IMDUR) 120 MG 24 hr tablet TAKE 1 TABLET IN THE MORNING AND 60 MG (1/2 TABLET) IN THE EVENING  . nitroGLYCERIN (NITROSTAT) 0.4 MG SL tablet Place 1 tablet (0.4 mg total) under the tongue every 5 (five) minutes as needed.  . Omega-3 Fatty Acids (FISH OIL) 1000 MG CAPS Take 1 capsule by mouth daily.    Marland Kitchen VASOTEC 10 MG tablet TAKE 1 TABLET (10 MG TOTAL) BY MOUTH DAILY.         In terms of his social history  reports that he has never smoked. He has never used smokeless tobacco. He reports that he does not drink alcohol or use illicit drugs.  Review of Systems  Constitutional: Negative.   Respiratory: Negative.   Cardiovascular: Negative.   Musculoskeletal:       Arm pain  Neurological: Positive for dizziness.  All other systems reviewed and are negative.   BP 130/70 mmHg  Pulse 56  Ht 5' 7.5" (1.715 m)  Wt 208 lb (94.348 kg)  BMI 32.08 kg/m2   Physical Exam  Physical Exam  Nursing note and vitals reviewed. Presenting in a wheelchair Constitutional: He is oriented to person, place, and time. He appears well-developed and well-nourished.  HENT:  Head: Normocephalic.  Nose: Nose normal.  Mouth/Throat: Oropharynx is clear and moist.  Eyes: Conjunctivae are normal. Pupils are equal, round, and reactive  to light.  Neck: Normal range of motion. Neck supple. No JVD present. Carotid bruit is present. on the left Cardiovascular: Normal rate, regular rhythm, S1 normal, S2 normal and intact distal pulses.  Exam reveals no gallop and no friction rub.   Murmur heard.trace to 1+ pitting edema bilateral lower extremities  Crescendo systolic murmur is present with a grade of 2/6  Pulmonary/Chest: Effort normal and breath sounds normal. No respiratory distress. He has no wheezes. He has no rales. He exhibits no tenderness.  Abdominal: Soft. Bowel sounds are normal. He exhibits  no distension. There is no tenderness.  Musculoskeletal: Normal range of motion. He exhibits no edema and no tenderness.  Lymphadenopathy:    He has no cervical adenopathy.  Neurological: He is alert and oriented to person, place, and time. Coordination normal.  Skin: Skin is warm and dry. No rash noted. No erythema.  Psychiatric: He has a normal mood and affect. His behavior is normal. Judgment and thought content normal.      Assessment and Plan

## 2014-07-07 NOTE — Assessment & Plan Note (Signed)
Long discussion today concerning his leg edema. Etiology is not clear. Possible venous insufficiency, mild lymphedema, unable to exclude worsening diastolic CHF. Encouraged him to take Lasix 20 twice a day as he is taking. We'll check basic metabolic panel and BNP today. If swelling gets worse, could take extra Lasix.

## 2014-07-07 NOTE — Assessment & Plan Note (Signed)
Currently with no symptoms of angina. No further workup at this time. Continue current medication regimen. Not on a statin per the wishes of the family

## 2014-07-07 NOTE — Assessment & Plan Note (Signed)
He continues to take nitroglycerin relatively regularly for any arm pain. Denies having regular chest pain with exertion. Symptoms do not seem to be progressing concerning for unstable angina

## 2014-07-07 NOTE — Assessment & Plan Note (Signed)
Blood pressure is well controlled on today's visit. No changes made to the medications. 

## 2014-07-07 NOTE — Assessment & Plan Note (Signed)
Severe disease noted in 2014. Family recommending medical management as he is not a surgical candidate

## 2014-07-08 LAB — BASIC METABOLIC PANEL
BUN / CREAT RATIO: 17 (ref 10–22)
BUN: 23 mg/dL (ref 8–27)
CO2: 23 mmol/L (ref 18–29)
Calcium: 9.4 mg/dL (ref 8.6–10.2)
Chloride: 95 mmol/L — ABNORMAL LOW (ref 97–108)
Creatinine, Ser: 1.37 mg/dL — ABNORMAL HIGH (ref 0.76–1.27)
GFR calc Af Amer: 54 mL/min/{1.73_m2} — ABNORMAL LOW (ref >59)
GFR calc non Af Amer: 47 mL/min/{1.73_m2} — ABNORMAL LOW (ref >59)
Glucose: 91 mg/dL (ref 65–99)
Potassium: 4.3 mmol/L (ref 3.5–5.2)
SODIUM: 135 mmol/L (ref 134–144)

## 2014-07-08 LAB — BRAIN NATRIURETIC PEPTIDE

## 2014-07-11 ENCOUNTER — Ambulatory Visit: Payer: Medicare Other

## 2014-07-11 LAB — BASIC METABOLIC PANEL

## 2014-07-11 LAB — BRAIN NATRIURETIC PEPTIDE: BNP: 311.2 pg/mL — AB (ref 0.0–100.0)

## 2014-07-24 ENCOUNTER — Ambulatory Visit: Payer: Medicare Other | Admitting: Cardiovascular Disease

## 2014-07-28 ENCOUNTER — Other Ambulatory Visit: Payer: Self-pay | Admitting: Cardiovascular Disease

## 2014-12-01 DEATH — deceased

## 2014-12-11 ENCOUNTER — Other Ambulatory Visit: Payer: Self-pay | Admitting: Cardiovascular Disease
